# Patient Record
Sex: Female | Born: 1935 | Race: White | Hispanic: No | Marital: Married | Smoking: Never smoker
Health system: Southern US, Community
[De-identification: ages and names within clinical notes are randomized; demographics above are authoritative.]

## PROBLEM LIST (undated history)

## (undated) DIAGNOSIS — Z9889 Other specified postprocedural states: Secondary | ICD-10-CM

## (undated) DIAGNOSIS — H3530 Unspecified macular degeneration: Secondary | ICD-10-CM

## (undated) DIAGNOSIS — Z8679 Personal history of other diseases of the circulatory system: Secondary | ICD-10-CM

## (undated) DIAGNOSIS — G20A1 Parkinson's disease without dyskinesia, without mention of fluctuations: Secondary | ICD-10-CM

## (undated) DIAGNOSIS — R112 Nausea with vomiting, unspecified: Secondary | ICD-10-CM

## (undated) DIAGNOSIS — W19XXXA Unspecified fall, initial encounter: Secondary | ICD-10-CM

## (undated) DIAGNOSIS — Z973 Presence of spectacles and contact lenses: Secondary | ICD-10-CM

## (undated) DIAGNOSIS — G20 Parkinson's disease: Secondary | ICD-10-CM

## (undated) DIAGNOSIS — S22000A Wedge compression fracture of unspecified thoracic vertebra, initial encounter for closed fracture: Secondary | ICD-10-CM

## (undated) DIAGNOSIS — R296 Repeated falls: Secondary | ICD-10-CM

## (undated) DIAGNOSIS — R609 Edema, unspecified: Secondary | ICD-10-CM

## (undated) DIAGNOSIS — R600 Localized edema: Secondary | ICD-10-CM

## (undated) DIAGNOSIS — L570 Actinic keratosis: Secondary | ICD-10-CM

## (undated) DIAGNOSIS — C801 Malignant (primary) neoplasm, unspecified: Secondary | ICD-10-CM

## (undated) DIAGNOSIS — E559 Vitamin D deficiency, unspecified: Secondary | ICD-10-CM

## (undated) DIAGNOSIS — M1990 Unspecified osteoarthritis, unspecified site: Secondary | ICD-10-CM

## (undated) DIAGNOSIS — M479 Spondylosis, unspecified: Secondary | ICD-10-CM

## (undated) DIAGNOSIS — E039 Hypothyroidism, unspecified: Secondary | ICD-10-CM

## (undated) DIAGNOSIS — R1013 Epigastric pain: Secondary | ICD-10-CM

## (undated) DIAGNOSIS — K859 Acute pancreatitis without necrosis or infection, unspecified: Secondary | ICD-10-CM

## (undated) DIAGNOSIS — M810 Age-related osteoporosis without current pathological fracture: Secondary | ICD-10-CM

## (undated) DIAGNOSIS — K219 Gastro-esophageal reflux disease without esophagitis: Secondary | ICD-10-CM

## (undated) DIAGNOSIS — E7800 Pure hypercholesterolemia, unspecified: Secondary | ICD-10-CM

## (undated) HISTORY — DX: Repeated falls: R29.6

## (undated) HISTORY — PX: COLONOSCOPY: SHX174

## (undated) HISTORY — DX: Actinic keratosis: L57.0

## (undated) HISTORY — DX: Parkinson's disease: G20

## (undated) HISTORY — PX: MOHS SURGERY: SUR867

## (undated) HISTORY — PX: TONSILLECTOMY: SUR1361

## (undated) HISTORY — DX: Vitamin D deficiency, unspecified: E55.9

## (undated) HISTORY — PX: HEMORRHOID SURGERY: SHX153

## (undated) HISTORY — PX: APPENDECTOMY: SHX54

## (undated) HISTORY — DX: Unspecified fall, initial encounter: W19.XXXA

## (undated) HISTORY — DX: Wedge compression fracture of unspecified thoracic vertebra, initial encounter for closed fracture: S22.000A

## (undated) HISTORY — PX: CARPAL TUNNEL RELEASE: SHX101

## (undated) HISTORY — DX: Localized edema: R60.0

## (undated) HISTORY — PX: EYE SURGERY: SHX253

## (undated) HISTORY — DX: Unspecified macular degeneration: H35.30

## (undated) HISTORY — DX: Pure hypercholesterolemia, unspecified: E78.00

## (undated) HISTORY — DX: Personal history of other diseases of the circulatory system: Z86.79

## (undated) HISTORY — DX: Edema, unspecified: R60.9

## (undated) HISTORY — PX: HAND SURGERY: SHX662

## (undated) HISTORY — DX: Acute pancreatitis without necrosis or infection, unspecified: K85.90

## (undated) HISTORY — DX: Hypercalcemia: E83.52

## (undated) HISTORY — PX: CATARACT EXTRACTION: SUR2

## (undated) HISTORY — DX: Parkinson's disease without dyskinesia, without mention of fluctuations: G20.A1

## (undated) HISTORY — DX: Spondylosis, unspecified: M47.9

## (undated) HISTORY — DX: Epigastric pain: R10.13

## (undated) HISTORY — DX: Malignant (primary) neoplasm, unspecified: C80.1

## (undated) HISTORY — DX: Age-related osteoporosis without current pathological fracture: M81.0

## (undated) HISTORY — PX: ABDOMINAL HYSTERECTOMY: SHX81

---

## 1999-08-16 ENCOUNTER — Ambulatory Visit (HOSPITAL_COMMUNITY): Admission: RE | Admit: 1999-08-16 | Discharge: 1999-08-16 | Payer: Self-pay | Admitting: *Deleted

## 1999-08-29 ENCOUNTER — Encounter: Payer: Self-pay | Admitting: Family Medicine

## 1999-08-29 ENCOUNTER — Encounter: Admission: RE | Admit: 1999-08-29 | Discharge: 1999-08-29 | Payer: Self-pay | Admitting: Family Medicine

## 2000-08-30 ENCOUNTER — Encounter: Admission: RE | Admit: 2000-08-30 | Discharge: 2000-08-30 | Payer: Self-pay | Admitting: Family Medicine

## 2000-08-30 ENCOUNTER — Encounter: Payer: Self-pay | Admitting: Family Medicine

## 2000-09-04 ENCOUNTER — Ambulatory Visit (HOSPITAL_COMMUNITY): Admission: RE | Admit: 2000-09-04 | Discharge: 2000-09-04 | Payer: Self-pay | Admitting: Surgery

## 2000-09-04 ENCOUNTER — Encounter: Payer: Self-pay | Admitting: Surgery

## 2001-09-02 ENCOUNTER — Encounter: Admission: RE | Admit: 2001-09-02 | Discharge: 2001-09-02 | Payer: Self-pay | Admitting: Family Medicine

## 2001-09-02 ENCOUNTER — Encounter: Payer: Self-pay | Admitting: Family Medicine

## 2002-01-22 ENCOUNTER — Encounter: Payer: Self-pay | Admitting: Family Medicine

## 2002-01-22 ENCOUNTER — Other Ambulatory Visit: Admission: RE | Admit: 2002-01-22 | Discharge: 2002-01-22 | Payer: Self-pay | Admitting: Family Medicine

## 2002-01-22 ENCOUNTER — Ambulatory Visit (HOSPITAL_COMMUNITY): Admission: RE | Admit: 2002-01-22 | Discharge: 2002-01-22 | Payer: Self-pay | Admitting: Family Medicine

## 2002-09-03 ENCOUNTER — Encounter: Admission: RE | Admit: 2002-09-03 | Discharge: 2002-09-03 | Payer: Self-pay | Admitting: Family Medicine

## 2002-09-03 ENCOUNTER — Encounter: Payer: Self-pay | Admitting: Family Medicine

## 2003-07-22 ENCOUNTER — Ambulatory Visit (HOSPITAL_COMMUNITY): Admission: RE | Admit: 2003-07-22 | Discharge: 2003-07-22 | Payer: Self-pay | Admitting: Gastroenterology

## 2003-09-16 ENCOUNTER — Encounter: Admission: RE | Admit: 2003-09-16 | Discharge: 2003-09-16 | Payer: Self-pay | Admitting: Family Medicine

## 2004-10-04 ENCOUNTER — Encounter: Admission: RE | Admit: 2004-10-04 | Discharge: 2004-10-04 | Payer: Self-pay | Admitting: Family Medicine

## 2005-03-01 ENCOUNTER — Other Ambulatory Visit: Admission: RE | Admit: 2005-03-01 | Discharge: 2005-03-01 | Payer: Self-pay | Admitting: Family Medicine

## 2005-07-27 ENCOUNTER — Encounter: Admission: RE | Admit: 2005-07-27 | Discharge: 2005-07-27 | Payer: Self-pay | Admitting: Orthopedic Surgery

## 2005-08-02 ENCOUNTER — Ambulatory Visit (HOSPITAL_COMMUNITY): Admission: RE | Admit: 2005-08-02 | Discharge: 2005-08-02 | Payer: Self-pay | Admitting: Orthopedic Surgery

## 2005-08-02 ENCOUNTER — Encounter (INDEPENDENT_AMBULATORY_CARE_PROVIDER_SITE_OTHER): Payer: Self-pay | Admitting: *Deleted

## 2005-08-02 ENCOUNTER — Ambulatory Visit (HOSPITAL_BASED_OUTPATIENT_CLINIC_OR_DEPARTMENT_OTHER): Admission: RE | Admit: 2005-08-02 | Discharge: 2005-08-02 | Payer: Self-pay | Admitting: Orthopedic Surgery

## 2005-10-06 ENCOUNTER — Encounter: Admission: RE | Admit: 2005-10-06 | Discharge: 2005-10-06 | Payer: Self-pay | Admitting: Family Medicine

## 2006-08-24 ENCOUNTER — Emergency Department (HOSPITAL_COMMUNITY): Admission: EM | Admit: 2006-08-24 | Discharge: 2006-08-24 | Payer: Self-pay | Admitting: Emergency Medicine

## 2006-08-31 ENCOUNTER — Emergency Department (HOSPITAL_COMMUNITY): Admission: EM | Admit: 2006-08-31 | Discharge: 2006-08-31 | Payer: Self-pay | Admitting: Emergency Medicine

## 2006-10-09 ENCOUNTER — Encounter: Admission: RE | Admit: 2006-10-09 | Discharge: 2006-10-09 | Payer: Self-pay | Admitting: Family Medicine

## 2007-10-14 ENCOUNTER — Encounter: Admission: RE | Admit: 2007-10-14 | Discharge: 2007-10-14 | Payer: Self-pay | Admitting: Family Medicine

## 2007-11-07 HISTORY — PX: FOOT SURGERY: SHX648

## 2008-09-07 ENCOUNTER — Encounter: Admission: RE | Admit: 2008-09-07 | Discharge: 2008-09-07 | Payer: Self-pay | Admitting: Orthopedic Surgery

## 2008-09-08 ENCOUNTER — Ambulatory Visit (HOSPITAL_BASED_OUTPATIENT_CLINIC_OR_DEPARTMENT_OTHER): Admission: RE | Admit: 2008-09-08 | Discharge: 2008-09-08 | Payer: Self-pay | Admitting: Orthopedic Surgery

## 2008-10-14 ENCOUNTER — Encounter: Admission: RE | Admit: 2008-10-14 | Discharge: 2008-10-14 | Payer: Self-pay | Admitting: Family Medicine

## 2008-11-27 ENCOUNTER — Other Ambulatory Visit: Admission: RE | Admit: 2008-11-27 | Discharge: 2008-11-27 | Payer: Self-pay | Admitting: Family Medicine

## 2009-10-15 ENCOUNTER — Encounter: Admission: RE | Admit: 2009-10-15 | Discharge: 2009-10-15 | Payer: Self-pay | Admitting: Family Medicine

## 2010-10-17 ENCOUNTER — Encounter
Admission: RE | Admit: 2010-10-17 | Discharge: 2010-10-17 | Payer: Self-pay | Source: Home / Self Care | Attending: Family Medicine | Admitting: Family Medicine

## 2011-03-21 NOTE — Op Note (Signed)
Sarah Callahan, Sarah Callahan              ACCOUNT NO.:  [id number]   MEDICAL RECORD NO.:  [id number]          PATIENT TYPE:  AMB   LOCATION:  DSC                          FACILITY:  [facility]   PHYSICIAN:  Leonides Grills, M.D.     DATE OF BIRTH:  12-28-1930   DATE OF PROCEDURE:  09/08/2008  DATE OF DISCHARGE:                               OPERATIVE REPORT   PREOPERATIVE DIAGNOSES:  1. Left hallux valgus.  2. Left tight gastroc.  3. Left second metatarsalgia.  4. Left second through fourth hammertoes.   POSTOPERATIVE DIAGNOSES:  1. Left hallux valgus.  2. Left tight gastroc.  3. Left second metatarsalgia.  4. Left second through fourth hammertoes.   OPERATION:  1. Left Chevron bunionectomy.  2. Left great toe digital nerve neurolysis.  3. Left second Weil metatarsal shortening osteotomy.  4. Left gastroc slide.  5. Left second through fourth toes metatarsophalangeal joint dorsal      capsulotomy with collateral release.  6. Left second through fourth toes proximal phalanx head resection.  7. Left second through fourth toes extensor digitorum brevis to      extensor digitorum longus tendon transfer.  8. Left second through fourth toes flexor digitorum longus to proximal      phalanx tendon transfer.   ANESTHESIA:  General.   SURGEON:  Leonides Grills, MD   ASSISTANT:  Richardean Canal, PA-C   ESTIMATED BLOOD LOSS:  Minimal.   TOURNIQUET TIME:  Approximately hour and half.   COMPLICATIONS:  None.   DISPOSITION:  Stable to PR.   INDICATIONS:  This is a 77 year old female who had previous left second  hammertoe reconstruction that well enough having a dorsally dislocated  MTP joint and persistent pain and callus on the plantar aspect of her  great toe.  Her second toe was riding over her great toe and her hallux  valgus deformity has worsened and now has become symptomatic.  She was  consented for the above procedure.  All risks including infection,  neurovascular injury, nonunion,  malunion, hardware rotation, hardware  failure, persistent pain, worsening pain, prolonged recovery, stiffness,  arthritis, development of hallux varus as well as recurrence of hallux  valgus, cock-up toe deformity, the areas of persistent pain, worse pain  were all explained.  Questions are encouraged and answered.   OPERATION:  The patient was brought to the operating room and placed in  the supine position after adequate general endotracheal tube anesthesia  was administered as well as Ancef 1 g IV piggyback.  A bump was placed  on the left ipsilateral hip.  All bony prominence were well padded.  Left lower extremity was prepped and draped in the sterile manner with  proximally placed thigh tourniquet.  Limb was gravity exsanguinated.  Tourniquet was elevated to 290 mmHg.  A longitudinal incision over the  medial aspect of the gastrocnemius muscle tendinous junction was then  made.  Dissection was carried down through the skin.  Hemostasis was  obtained.  Fascia was opened in line with the incision.  Conjoined  region was developed through the gastroc soleus.  Soft tissue  was  elevated off the posterior aspect of the gastrocnemius.  Sural nerve was  identified and protected serially.  Gastrocnemius was then released with  a curved Mayo scissors.  This had an excellent release of the tight  gastroc.  The area was copiously irrigated with normal saline.  Subcu  was closed with 3-0 Vicryl.  Skin was closed with 4-0 Monocryl  subcuticular stitch.  Steri-Strips were applied.  We then made a  longitudinal incision midline over the medial aspect of the left great  toe MTP joint.  Dissection was carried down through the skin.  Hemostasis was obtained.  Great toe digital nerve dorsal medially was  then carefully dissected out and a formal digital nerve neurolysis was  then performed.  This was retracted out of harm's way throughout the  procedure.  A L-shaped capsulotomy was then made.  Simple  bunionectomy  was then performed with a sagittal saw.  Rocky Link Johnson's ridge was then  rounded off with a rongeur.  The area of joint was copiously irrigated  with normal saline.  Lateral capsule was then released with curved  Beaver blade protecting the cartilage surfaces with a Engineer, site.  This had  outstanding release of tight capsule.  Then, we identified the center of  the head.  Then, with a sagittal saw, a Chevron osteotomy was then  created protecting the soft tissue both superiorly and inferiorly.  Head  was then translated approximately 2-3 mm laterally and fixed with 2.0 mm  fully threaded cortical set screw using 1.5-mm drill hole respectively.  This had excellent purchase and maintenance of the correction.  The  redundant bone medially was trimmed off with the sagittal saw.  The  joint area was copiously irrigated with normal saline.  Capsules was  reconstructed and advanced both superiorly and proximally and  reconstructed with 2-0 Vicryl stitches.  This had an Conservation officer, historic buildings.  The area was copiously irrigated with normal saline.  A longitudinal  incision was then made over the left second toe.  Dissection was carried  down through the skin.  Hemostasis was obtained.  There was a tremendous  amount of scar tissue dorsally.  This was very difficult to find the  extensor tendons for the second toe.  The incision was extended  proximally and both tendons were identified.  The extensor digitorum  longus was tenotomized proximally and medially and the brevis distal  lateral was retracted out of harm's way for later transfer.  We then  performed a MTP joint dorsal capsulotomy and collaterally.  The toe was  completely dislocated and required a soft tissue release that was  relatively extensive.  We identified the FPL tendon as well and  protected obviously the soft tissues both medially and laterally.  We  then skeletonized the distal aspect of proximal phalanx and with a  sagittal  saw removed the proximal phalanx head.  We then identified the  FPL tendon.  This apparently was transferred to the base of the proximal  phalanx, but we cut this area.  We cut it toward to the proximal  phalanx, drilled a drill hole into the base of the proximal phalanx  using a 2.5 and 3.5 mm drill holes respectively and then transferred the  FDL through this drill hole using 3-0 PDS.  This had an Interior and spatial designer. We plantarflexed the second toe and then with stacked sagittal  saw blades, we performed a Weil cup metatarsal area osteotomy parallel  to the plantar aspect  of foot.  Once this was mobilized and there was  scar tissue around this area, we then fixed this with a 1.5-mm fully-  threaded cortical set screw using 1.1-mm drill hole respectively.  This  had excellent purchase and maintenance of the correction.  Redundant  bone ledge dorsally was trimmed off the rongeur.  Bone wax was applied  to expose bone surfaces.  Once the K-wire was fired antegrade through  the middle distal phalanx, we then reduced the PIP joint as well as  tension on the FDL tendon, fired this retrograde across the MTP joint.  We held the toe in an outstanding position.  We then transferred the EDB  to EDL tendon dorsally using 3-0 PDS stitch and sewed this to the stump  of the FDL tendon as well.  We then made a longitudinal incision over  the left third toe dorsally.  Dissection was carried down through the  skin.  Hemostasis was obtained.  EDB and EDL tendons were identified.  The EDL tendon was tenotomized proximal and medial in the brevis  distally and laterally retracting out of harm's way.  MTP joint dorsal  capsulotomy and collateral release were then performed with a 15 blade  scalpel protecting the soft tissue both medially and laterally.  We then  skeletonized the distal aspect of the proximal phalanx.  The head was  then removed with a rongeur followed by bone cutter.  This cut was made   perpendicular to the long axis of proximal phalanx.  We then made a  longitudinal incision into the PIP joint plantar plate.  FDL tendon was  identified identify and carefully dissect out.  A tenotomy was then  made.  We then transferred the FDL to the proximal phalanx splitting the  tendon and transferring it and then sewing it to itself with 3-0 PDS  stitch.  This had an Conservation officer, historic buildings.  We then placed a 0.054 K-wire  antegrade through the middle and distal phalanx, reduced the PIP joint  and fired this retrograde with the toe held in reduced position.  We  then transferred the EDB to EDL tendon using 3-0 PDS stitch and then  sewed this to the limbs of the FDL tendon as well.  Again, this had an  outstanding repair.  The same exact procedure was performed for the  fourth toe through a separate incision.  Tourniquet was deflated.  Hemostasis was obtained.  All toes pinked up nicely.  Stress x-rays were  obtained, AP and lateral planes showed no gross motion, fixation of  proper position, and excellent alignment as well.  Skin-relieving  incisions were made on either side of the K-wire.  The K-wires were  bent, cut, and capped.  Subcu was closed with 4-0 PDS.  Skin was closed  with 4-0 nylon.  Sterile dressing was applied.  Roger-Mann dressing was  applied.  Cam walker boot was applied.  The patient was stable to the  PR.      Leonides Grills, M.D.  Electronically Signed     PB/MEDQ  D:  09/08/2008  T:  09/09/2008  Job:  [id number]

## 2011-03-24 NOTE — Op Note (Signed)
Sarah Callahan, Sarah Callahan              ACCOUNT NO.:  [id number]   MEDICAL RECORD NO.:  [id number]          PATIENT TYPE:  AMB   LOCATION:  DSC                          FACILITY:  [facility]   PHYSICIAN:  Cindee Salt, M.D.       DATE OF BIRTH:  [birth year]   DATE OF PROCEDURE:  08/02/2005  DATE OF DISCHARGE:                                 OPERATIVE REPORT   PREOPERATIVE DIAGNOSIS:  Mass left ring finger.   POSTOPERATIVE DIAGNOSIS:  Mass left ring finger.  Plus flexor sheath cyst  left ring finger.   OPERATION:  Excision probable fibroma, excision flexor sheath cyst left ring  finger.   SURGEON:  Cindee Salt, M.D.   ASSISTANTCarolyne Fiscal.   ANESTHESIA:  General.   DATE OF OPERATION:  August 02, 2005.   HISTORY:  The patient is a 77 year old female with a history of a painful  mass over the metacarpophalangeal joint left ring finger. This appears to be  a probable early Dupuytren's nodule. She is however concerned with  considerable pain. She is admitted for excision being well aware of the  potential for extension exacerbation of Dupuytren's should this be excised.   PROCEDURE:  The patient is brought to the operating room where a general  anesthetic was carried out without difficulty after marking by both patient  and surgeon. She was prepped and draped using DuraPrep, supine position with  her left arm free. The limb was exsanguinated with an Esmarch bandage.  Tourniquet placed high on the arm was inflated to 250 mmHg. A volar Brunner  incision was made, carried down through subcutaneous tissue. The mass was  immediately apparent. This was in the palmar fascia. This was excised in  toto and sent to pathology. Beneath this with protection of the  neurovascular bundles, a flexor sheath cyst was immediately apparent. This  was excised and release of the A1 pulley performed. The cyst was sent to  pathology. The wound was copiously irrigated with saline. The skin closed  interrupted 5-0  nylon sutures. Sterile compressive dressing was applied. The  patient tolerated the procedure well and was taken to the recovery room for  observation in satisfactory condition. She is discharged home to return to  the [facility] [place] in 1 week on [place] [facility].           ______________________________  Cindee Salt, M.D.     GK/MEDQ  D:  08/02/2005  T:  08/03/2005  Job:  [id number]

## 2011-03-24 NOTE — Op Note (Signed)
   NAMECHRISTI, Sarah Callahan                        ACCOUNT NO.:  [id number]   MEDICAL RECORD NO.:  [id number]                   PATIENT TYPE:  AMB   LOCATION:  ENDO                                 FACILITY:  [facility]   PHYSICIAN:  Anselmo Rod, M.D.               DATE OF BIRTH:  1931-07-24   DATE OF PROCEDURE:  07/22/2003  DATE OF DISCHARGE:                                 OPERATIVE REPORT   PROCEDURE PERFORMED:  Screening colonoscopy.   ENDOSCOPIST:  Charna Elizabeth, M.D.   INSTRUMENT USED:  Olympus video colonoscope.   INDICATIONS FOR PROCEDURE:  History of rectal bleeding with change in bowel  habits and worsening constipation in a 77 year old white female.  Rule out  colonic polyps, masses, etc.   PREPROCEDURE PREPARATION:  Informed consent was procured from the patient.  The patient was fasted for eight hours prior to the procedure and prepped  with a bottle of magnesium citrate and a gallon of GoLYTELY the night prior  to the procedure.   PREPROCEDURE PHYSICAL:  The patient had stable vital signs.  Neck supple.  Chest clear to auscultation.  S1 and S2 regular.  Abdomen soft with normal  bowel sounds.   DESCRIPTION OF PROCEDURE:  The patient was placed in left lateral decubitus  position and sedated with 70 mg of Demerol and 7 mg of Versed in slow  incremental doses.  Once the patient was adequately sedated and maintained  on low flow oxygen and continuous cardiac monitoring, the Olympus video  colonoscope was advanced from the rectum to the cecum without difficulty.  The appendicular orifice and ileocecal valve were clearly visualized and  photographed.  A pill was noted in the cecum.  No masses, polyps, erosions,  ulcerations or diverticula were present.  Small internal hemorrhoids were  seen on retroflexion in the rectum.  The patient tolerated the procedure  well without complication.   IMPRESSION:  Normal colonoscopy up to the cecum except for small internal  hemorrhoids.   RECOMMENDATIONS:  1. A high fiber diet with liberal fluid intake has been recommended.  2. Repeat colorectal cancer screening is recommended in the next 10 years     unless the patient develops any abnormal symptoms in the interim.  3. Outpatient follow-up in the next two weeks or earlier if need be.                                                   Anselmo Rod, M.D.    JNM/MEDQ  D:  07/22/2003  T:  07/22/2003  Job:  [id number]   cc:   Stacie Acres. White, M.D.  [address]  [employer] [place]  [place] [address]  Fax: [phone]

## 2011-05-25 ENCOUNTER — Ambulatory Visit
Admission: RE | Admit: 2011-05-25 | Discharge: 2011-05-25 | Disposition: A | Discharge status: Home / Self Care | Payer: Medicare Other | Source: Ambulatory Visit | Attending: Family Medicine | Admitting: Family Medicine

## 2011-05-25 ENCOUNTER — Other Ambulatory Visit: Payer: Self-pay | Admitting: Family Medicine

## 2011-05-25 DIAGNOSIS — R609 Edema, unspecified: Secondary | ICD-10-CM

## 2011-06-13 ENCOUNTER — Ambulatory Visit
Admission: RE | Admit: 2011-06-13 | Discharge: 2011-06-13 | Disposition: A | Discharge status: Home / Self Care | Payer: Medicare Other | Source: Ambulatory Visit | Attending: Family Medicine | Admitting: Family Medicine

## 2011-06-13 ENCOUNTER — Other Ambulatory Visit: Payer: Self-pay | Admitting: Family Medicine

## 2011-06-13 DIAGNOSIS — R071 Chest pain on breathing: Secondary | ICD-10-CM

## 2011-08-08 LAB — BASIC METABOLIC PANEL
BUN: 9
CO2: 30
Calcium: 9.8
Chloride: 101
Creatinine, Ser: 0.59
GFR calc Af Amer: >60
GFR calc non Af Amer: >60
Glucose, Bld: 219 — ABNORMAL HIGH
Potassium: 3.7
Sodium: 137

## 2011-08-08 LAB — POCT HEMOGLOBIN-HEMACUE: Hemoglobin: 13.1

## 2011-08-08 LAB — GLUCOSE, CAPILLARY: Glucose-Capillary: 108 — ABNORMAL HIGH

## 2011-08-22 ENCOUNTER — Encounter (HOSPITAL_COMMUNITY): Payer: Medicare Other | Attending: Family Medicine

## 2011-08-22 DIAGNOSIS — M810 Age-related osteoporosis without current pathological fracture: Secondary | ICD-10-CM | POA: Insufficient documentation

## 2011-09-19 ENCOUNTER — Other Ambulatory Visit: Payer: Self-pay | Admitting: Family Medicine

## 2011-09-19 DIAGNOSIS — Z1231 Encounter for screening mammogram for malignant neoplasm of breast: Secondary | ICD-10-CM

## 2011-10-25 ENCOUNTER — Ambulatory Visit
Admission: RE | Admit: 2011-10-25 | Discharge: 2011-10-25 | Disposition: A | Discharge status: Home / Self Care | Payer: Medicare Other | Source: Ambulatory Visit | Attending: Family Medicine | Admitting: Family Medicine

## 2011-10-25 DIAGNOSIS — Z1231 Encounter for screening mammogram for malignant neoplasm of breast: Secondary | ICD-10-CM

## 2011-12-11 ENCOUNTER — Encounter: Payer: Medicare Other | Admitting: Vascular Surgery

## 2012-01-05 ENCOUNTER — Ambulatory Visit: Payer: Medicare Other | Attending: Diagnostic Neuroimaging | Admitting: Physical Therapy

## 2012-01-05 DIAGNOSIS — IMO0001 Reserved for inherently not codable concepts without codable children: Secondary | ICD-10-CM | POA: Insufficient documentation

## 2012-01-05 DIAGNOSIS — G20A1 Parkinson's disease without dyskinesia, without mention of fluctuations: Secondary | ICD-10-CM | POA: Insufficient documentation

## 2012-01-05 DIAGNOSIS — R269 Unspecified abnormalities of gait and mobility: Secondary | ICD-10-CM | POA: Insufficient documentation

## 2012-01-05 DIAGNOSIS — G20 Parkinson's disease: Secondary | ICD-10-CM | POA: Insufficient documentation

## 2012-01-10 ENCOUNTER — Ambulatory Visit: Payer: Medicare Other | Admitting: Physical Therapy

## 2012-01-16 ENCOUNTER — Ambulatory Visit: Payer: Medicare Other | Admitting: Physical Therapy

## 2012-01-19 ENCOUNTER — Ambulatory Visit: Payer: Medicare Other | Admitting: Physical Therapy

## 2012-01-31 ENCOUNTER — Ambulatory Visit: Payer: Medicare Other | Admitting: Physical Therapy

## 2012-02-01 ENCOUNTER — Encounter: Payer: Self-pay | Admitting: Vascular Surgery

## 2012-02-05 ENCOUNTER — Encounter: Payer: Self-pay | Admitting: Vascular Surgery

## 2012-02-05 ENCOUNTER — Ambulatory Visit (INDEPENDENT_AMBULATORY_CARE_PROVIDER_SITE_OTHER): Payer: Medicare Other | Admitting: *Deleted

## 2012-02-05 ENCOUNTER — Ambulatory Visit (INDEPENDENT_AMBULATORY_CARE_PROVIDER_SITE_OTHER): Payer: Medicare Other | Admitting: Vascular Surgery

## 2012-02-05 VITALS — BP 115/81 | HR 88 | Resp 18 | Ht 65.0 in | Wt 116.0 lb

## 2012-02-05 DIAGNOSIS — I83893 Varicose veins of bilateral lower extremities with other complications: Secondary | ICD-10-CM | POA: Insufficient documentation

## 2012-02-05 DIAGNOSIS — M7989 Other specified soft tissue disorders: Secondary | ICD-10-CM

## 2012-02-05 DIAGNOSIS — I8310 Varicose veins of unspecified lower extremity with inflammation: Secondary | ICD-10-CM

## 2012-02-05 NOTE — Progress Notes (Deleted)
Subjective:     Patient ID: Sarah Callahan, female   DOB: Oct 31, 1931, 78 y.o.   MRN: [id number]  HPI this 78 year old female is evaluated for edema in the right lower extremity. About 3-4 months ago she developed significant swelling in the right leg. Venous duplex exam revealed no evidence of DVT. She has been having persistent edema in the right leg particularly in the lower half from the mid calf distally. This has not been relieved by short leg elastic compression stockings. She has no history of DVT thrombophlebitis stasis ulcers or bleeding. She has been taking Lasix with no improvement. She does have a achy heavy feeling in the right leg as the day progresses. Left leg has no symptoms. She does have a history of Xylocaine allergy but is able to take Xylocaine if pretreated with Benadryl.  Physician Discharge Summary  Patient ID: Sarah Callahan MRN: [id number] DOB/AGE: 1931-10-07 78 y.o.  Admit date: (Not on file) Discharge date: 02/05/2012  Admission Diagnosis: @ADMDX @  Discharge Diagnoses:  @ADMDX @  Secondary Diagnoses: Active Problems:  * No active hospital problems. *    Procedures: *** HERNIA:  Sarah Callahan is an 78 y.o. female who was referred for evaluation of a  {Symptoms; hernia:13574}. Symptoms were first noted {numbers; 0-10:33138} {Time; days-years:10146} ago. Symptoms or injury {did/did not:14019} occur at work. Pain is {hernia sx:13577} and {is/is not:9024} reducible. The patient has no symptoms of  {hernia etiology:13578::"chronic constipation","chronic cough","difficulty urinating"}. There {hpi assoc has/has/not:15037} previous hx of groin surgery.      Discharged Condition: {condition:18240}  Hospital Course: ***  Consults:  @CONSULT @  Significant Diagnostic Studies: CBC    Component Value Date/Time   HGB 13.1 POINT OF CARE RESULT 09/08/2008 0833    BMET    Component Value Date/Time   NA 137 09/07/2008 1600   K 3.7 09/07/2008 1600   CL 101 09/07/2008  1600   CO2 30 09/07/2008 1600   GLUCOSE 219* 09/07/2008 1600   BUN 9 09/07/2008 1600   CREATININE 0.59 09/07/2008 1600   CALCIUM 9.8 09/07/2008 1600   GFRNONAA >60 09/07/2008 1600   GFRAA  Value: >60        The eGFR has been calculated using the MDRD equation. This calculation has not been validated in all clinical 09/07/2008 1600    COAG No results found for this basename: INR, PROTIME   No results found for this basename: PTT    Disposition: Final discharge disposition not confirmed   Med List    Warning       Cannot display discharge medications because this is not an admission.              SignedJosephina Callahan 02/05/2012, 10:45 AM      Review of Systems patient has history of Parkinson's disease which affects her ability to ambulate long distances. He is able amulet one block or so. Denies chest pain, dyspnea on exertion, PND, orthopnea, hemoptysis. Does have a Xylocaine allergy which affected her many years ago with rash and wheezing.     Objective:   Physical Exam     Assessment:     ***    Plan:     ***

## 2012-02-05 NOTE — Progress Notes (Signed)
Addended by: Merri Ray A on: 02/05/2012 03:22 PM   Modules accepted: Orders

## 2012-02-05 NOTE — Progress Notes (Signed)
Subjective:     Patient ID: Sarah Callahan, female   DOB: 1931-07-30, 79 y.o.   MRN: [id number]  HPI this 78 year old female is evaluated for recent onset of edema in the right leg about 3-4 months ago. She had a venous duplex exam which revealed no evidence of DVT when the swelling began. She has no history of bulging varicose veins but has diffuse spider veins. She had no previous history of swelling in the right leg nor stasis ulcers, bleeding, DVT, or thrombophlebitis. The swelling has not improved with short leg elastic compression stockings. She develops a heavy aching feeling as the day progresses. This has not been improved by short leg elastic stockings. The left leg is asymptomatic. He does have a history of a Xylocaine allergy and that she is able to tolerate Xylocaine if pretreated with Benadryl.  Past Medical History  Diagnosis Date  . Diabetes mellitus   . Parkinson disease     History  Substance Use Topics  . Smoking status: Never Smoker   . Smokeless tobacco: Never Used  . Alcohol Use: No    Family History  Problem Relation Age of Onset  . Heart disease Mother   . Heart disease Father   . Heart disease Brother   . Heart disease Maternal Grandmother   . Heart disease Maternal Grandfather   . Heart disease Paternal Grandmother   . Heart disease Paternal Grandfather     Allergies  Allergen Reactions  . Codeine   . E-Mycin (Erythromycin Base) Nausea And Vomiting  . Keflex   . Penicillins   . Sulfa Drugs Cross Reactors   . Mercury Rash    Current outpatient prescriptions:carbidopa-levodopa (SINEMET) 25-100 MG per tablet, Take 1 tablet by mouth 3 (three) times daily., Disp: , Rfl: ;  furosemide (LASIX) 20 MG tablet, Take 20 mg by mouth 2 (two) times daily., Disp: , Rfl: ;  levothyroxine (SYNTHROID, LEVOTHROID) 75 MCG tablet, Take 75 mcg by mouth daily., Disp: , Rfl: ;  omeprazole (PRILOSEC) 40 MG capsule, Take 40 mg by mouth daily., Disp: , Rfl:  PARoxetine (PAXIL) 10  MG tablet, Take 10 mg by mouth every morning., Disp: , Rfl: ;  Vitamin D, Ergocalciferol, (DRISDOL) 50000 UNITS CAPS, Take 50,000 Units by mouth., Disp: , Rfl:   BP 115/81  Pulse 88  Resp 18  Ht 5\' 5"  (1.651 m)  Wt 116 lb (52.617 kg)  BMI 19.30 kg/m2  Body mass index is 19.30 kg/(m^2).         Review of Systems denies chest pain, dyspnea on exertion, PND, orthopnea,, [place]. Patient is not ambulate long distances because of her Parkinson's disease. Does have a remote history of Xylocaine allergy but has taken Xylocaine after being pretreated with Benadryl for dental appointments.    Objective:   Physical Exam blood pressure 115/81 heart rate 88 respirations 18 Gen.-alert and oriented x3 in no apparent distress HEENT normal for age Lungs no rhonchi or wheezing Cardiovascular regular rhythm no murmurs carotid pulses 3+ palpable no bruits audible Abdomen soft nontender no palpable masses Musculoskeletal free of  major deformities Skin clear -no rashes Neurologic normal Lower extremities 3+ femoral and dorsalis pedis pulses palpable bilaterally with 1-2+ edema in the right leg from the midcalf distally no edema on the left. There is 1-1/2 cm of discrepancy in circumference of the right ankle. Spider veins are present in both legs in the thigh and calf areas. No bulging varicosities are noted.  Today I ordered a venous  duplex exam of the right leg which are reviewed and interpreted. The right great saphenous vein has gross reflux from the saphenofemoral junction to the proximal calf. The deep system is normal with no evidence of DVT or reflux appear       Assessment:     Edema right lower extremity with increasing symptomatology despite elastic compression stockings. Gross reflux right great saphenous vein possibly causing above symptoms    Plan:     #1 long light elastic compression stockings 20-30 mm gradient #2 elevate legs during the day as much as possible #3 ibuprofen on  a regular basis #4 return in 3 months-if continued to have significant edema and symptoms we'll consider laser ablation right great saphenous vein If this is done patient will require pretreatment with Benadryl

## 2012-02-06 ENCOUNTER — Encounter: Payer: Self-pay | Admitting: Vascular Surgery

## 2012-02-07 ENCOUNTER — Ambulatory Visit: Payer: Medicare Other | Admitting: Physical Therapy

## 2012-02-12 NOTE — Procedures (Unsigned)
LOWER EXTREMITY VENOUS REFLUX EXAM  INDICATION:  Right lower extremity edema.  EXAM:  Using color-flow imaging and pulse Doppler spectral analysis, the right common femoral, femoral, popliteal, posterior tibial, greater and lesser saphenous veins were evaluated.  There is no evidence suggesting deep venous insufficiency in the right lower extremity.  The right saphenofemoral junction is not competent with Reflux of >545milliseconds. The right GSV is not competent with Reflux of >590milliseconds with the caliber as described below.   The right proximal short saphenous vein demonstrates competency.  GSV Diameter (used if found to be incompetent only)                                           Right    Left Proximal Greater Saphenous Vein           0.57 cm  cm Proximal-to-mid-thigh                     0.47 cm  cm Mid thigh                                 0.40 cm  cm Mid-distal thigh                          cm       cm Distal thigh                              0.37 cm  cm Knee                                      0.37 cm  cm  IMPRESSION: 1. The right great saphenous vein is not competent with reflux of     >554milliseconds. 2. The right great saphenous vein is not tortuous. 3. The deep venous system is competent. 4. The right small saphenous vein is competent.  ___________________________________________ Quita Skye. Hart Rochester, M.D.  EM/MEDQ  D:  02/06/2012  T:  02/06/2012  Job:  [id number]  cc:   Stacie Acres. Cliffton Asters, M.D.

## 2012-05-14 ENCOUNTER — Ambulatory Visit: Payer: Medicare Other | Admitting: Vascular Surgery

## 2012-06-03 ENCOUNTER — Ambulatory Visit: Payer: Medicare Other | Admitting: Vascular Surgery

## 2012-06-04 ENCOUNTER — Ambulatory Visit: Payer: Medicare Other | Admitting: Vascular Surgery

## 2012-06-17 ENCOUNTER — Encounter: Payer: Self-pay | Admitting: Vascular Surgery

## 2012-06-18 ENCOUNTER — Encounter: Payer: Self-pay | Admitting: Vascular Surgery

## 2012-06-18 ENCOUNTER — Ambulatory Visit (INDEPENDENT_AMBULATORY_CARE_PROVIDER_SITE_OTHER): Payer: Medicare Other | Admitting: Vascular Surgery

## 2012-06-18 VITALS — BP 114/69 | HR 86 | Resp 16 | Ht 66.0 in | Wt 124.6 lb

## 2012-06-18 DIAGNOSIS — I83893 Varicose veins of bilateral lower extremities with other complications: Secondary | ICD-10-CM

## 2012-06-18 NOTE — Progress Notes (Signed)
Subjective:     Patient ID: Sarah Callahan, female   DOB: 1931/03/30, 78 y.o.   MRN: [id number]  HPI this 78 year old female returns today for further evaluation for venous insufficiency right leg. I saw her 3 months ago for a history of recent onset edema. She had no evidence of DVT on duplex scanning. She did have gross reflux in the right great saphenous vein. She had no bulging varicosities. Over the past 3 months the swelling has completely resolved. She is not needing to wear the elastic compression stockings on a daily basis in the pain is resolved along with the edema. Left leg is also asymptomatic at this time.  Past Medical History  Diagnosis Date  . Diabetes mellitus   . Parkinson disease     History  Substance Use Topics  . Smoking status: Never Smoker   . Smokeless tobacco: Never Used  . Alcohol Use: No    Family History  Problem Relation Age of Onset  . Heart disease Mother   . Heart disease Father   . Heart disease Brother   . Heart disease Maternal Grandmother   . Heart disease Maternal Grandfather   . Heart disease Paternal Grandmother   . Heart disease Paternal Grandfather     Allergies  Allergen Reactions  . Cephalexin   . Codeine   . E-Mycin (Erythromycin Base) Nausea And Vomiting  . Lidocaine     Feels hot, turns red  . Penicillins   . Sulfa Drugs Cross Reactors   . Mercury Rash    Current outpatient prescriptions:carbidopa-levodopa (SINEMET) 25-100 MG per tablet, Take 1 tablet by mouth 3 (three) times daily., Disp: , Rfl: ;  furosemide (LASIX) 20 MG tablet, Take 20 mg by mouth 2 (two) times daily., Disp: , Rfl: ;  levothyroxine (SYNTHROID, LEVOTHROID) 75 MCG tablet, Take 75 mcg by mouth daily., Disp: , Rfl: ;  omeprazole (PRILOSEC) 40 MG capsule, Take 40 mg by mouth daily., Disp: , Rfl:  PARoxetine (PAXIL) 10 MG tablet, Take 10 mg by mouth every morning., Disp: , Rfl: ;  Vitamin D, Ergocalciferol, (DRISDOL) 50000 UNITS CAPS, Take 50,000 Units by mouth.,  Disp: , Rfl:   BP 114/69  Pulse 86  Resp 16  Ht 5\' 6"  (1.676 m)  Wt 124 lb 9.6 oz (56.518 kg)  BMI 20.11 kg/m2  Body mass index is 20.11 kg/(m^2).          Review of Systems denies chest pain, dyspnea on exertion, PND, orthopnea.     Objective:   Physical Exam blood pressure 114/69 heart rate 86 respirations 16 General elderly female in no apparent stress alert and oriented x3 Lungs no rhonchi or wheezing Lower extremities 3+ femoral to posterior cells pedis pulses palpable. No significant edema is noted on the left and minimal edema on the right. A few spider veins are noted no bulging varicosities or ulceration.     Assessment:     Edema right leg-now resolved Reflux right great saphenous system-does not need treatment at current time    Plan:     Return to see [place] on when necessary basis

## 2012-09-27 ENCOUNTER — Other Ambulatory Visit: Payer: Self-pay | Admitting: Family Medicine

## 2012-09-27 DIAGNOSIS — R198 Other specified symptoms and signs involving the digestive system and abdomen: Secondary | ICD-10-CM

## 2012-10-01 ENCOUNTER — Ambulatory Visit
Admission: RE | Admit: 2012-10-01 | Discharge: 2012-10-01 | Disposition: A | Discharge status: Home / Self Care | Payer: Medicare Other | Source: Ambulatory Visit | Attending: Family Medicine | Admitting: Family Medicine

## 2012-10-01 DIAGNOSIS — R198 Other specified symptoms and signs involving the digestive system and abdomen: Secondary | ICD-10-CM

## 2012-10-01 MED ORDER — IOHEXOL 300 MG/ML  SOLN
100.0000 mL | Freq: Once | INTRAMUSCULAR | Status: AC | PRN
  Administered 2012-10-01: 100 mL via INTRAVENOUS

## 2012-10-02 ENCOUNTER — Other Ambulatory Visit: Payer: Self-pay | Admitting: Family Medicine

## 2012-10-02 DIAGNOSIS — Z1231 Encounter for screening mammogram for malignant neoplasm of breast: Secondary | ICD-10-CM

## 2012-11-18 ENCOUNTER — Ambulatory Visit
Admission: RE | Admit: 2012-11-18 | Discharge: 2012-11-18 | Disposition: A | Discharge status: Home / Self Care | Payer: Medicare Other | Source: Ambulatory Visit | Attending: Family Medicine | Admitting: Family Medicine

## 2012-11-18 DIAGNOSIS — Z1231 Encounter for screening mammogram for malignant neoplasm of breast: Secondary | ICD-10-CM

## 2012-12-27 ENCOUNTER — Other Ambulatory Visit: Payer: Self-pay | Admitting: Family Medicine

## 2013-02-04 ENCOUNTER — Other Ambulatory Visit: Payer: Self-pay | Admitting: Diagnostic Neuroimaging

## 2013-02-13 ENCOUNTER — Other Ambulatory Visit (HOSPITAL_COMMUNITY): Payer: Self-pay | Admitting: Family Medicine

## 2013-02-13 DIAGNOSIS — R7989 Other specified abnormal findings of blood chemistry: Secondary | ICD-10-CM

## 2013-02-24 ENCOUNTER — Encounter (HOSPITAL_COMMUNITY): Payer: Medicare Other

## 2013-02-27 ENCOUNTER — Encounter (HOSPITAL_COMMUNITY): Payer: Medicare Other

## 2013-03-04 ENCOUNTER — Encounter (HOSPITAL_COMMUNITY)
Admission: RE | Admit: 2013-03-04 | Discharge: 2013-03-04 | Disposition: A | Discharge status: Home / Self Care | Payer: Medicare Other | Source: Ambulatory Visit | Attending: Family Medicine | Admitting: Family Medicine

## 2013-03-04 DIAGNOSIS — E215 Disorder of parathyroid gland, unspecified: Secondary | ICD-10-CM | POA: Insufficient documentation

## 2013-03-04 DIAGNOSIS — R7989 Other specified abnormal findings of blood chemistry: Secondary | ICD-10-CM

## 2013-03-04 MED ORDER — TECHNETIUM TC 99M SESTAMIBI GENERIC - CARDIOLITE
25.0000 | Freq: Once | INTRAVENOUS | Status: AC | PRN
  Administered 2013-03-04: 25 via INTRAVENOUS

## 2013-04-08 ENCOUNTER — Other Ambulatory Visit: Payer: Self-pay | Admitting: Orthopedic Surgery

## 2013-04-24 ENCOUNTER — Encounter (HOSPITAL_BASED_OUTPATIENT_CLINIC_OR_DEPARTMENT_OTHER): Payer: Self-pay | Admitting: *Deleted

## 2013-04-24 NOTE — Progress Notes (Signed)
No heart or resp problems 

## 2013-04-30 ENCOUNTER — Ambulatory Visit (HOSPITAL_BASED_OUTPATIENT_CLINIC_OR_DEPARTMENT_OTHER): Payer: Medicare Other | Admitting: Anesthesiology

## 2013-04-30 ENCOUNTER — Encounter (HOSPITAL_BASED_OUTPATIENT_CLINIC_OR_DEPARTMENT_OTHER): Payer: Self-pay | Admitting: Anesthesiology

## 2013-04-30 ENCOUNTER — Encounter (HOSPITAL_BASED_OUTPATIENT_CLINIC_OR_DEPARTMENT_OTHER)
Admission: RE | Disposition: A | Discharge status: Home / Self Care | Payer: Self-pay | Source: Ambulatory Visit | Attending: Orthopedic Surgery

## 2013-04-30 ENCOUNTER — Ambulatory Visit (HOSPITAL_BASED_OUTPATIENT_CLINIC_OR_DEPARTMENT_OTHER)
Admission: RE | Admit: 2013-04-30 | Discharge: 2013-04-30 | Disposition: A | Discharge status: Home / Self Care | Payer: Medicare Other | Source: Ambulatory Visit | Attending: Orthopedic Surgery | Admitting: Orthopedic Surgery

## 2013-04-30 DIAGNOSIS — G20A1 Parkinson's disease without dyskinesia, without mention of fluctuations: Secondary | ICD-10-CM | POA: Insufficient documentation

## 2013-04-30 DIAGNOSIS — F3289 Other specified depressive episodes: Secondary | ICD-10-CM | POA: Insufficient documentation

## 2013-04-30 DIAGNOSIS — K219 Gastro-esophageal reflux disease without esophagitis: Secondary | ICD-10-CM | POA: Insufficient documentation

## 2013-04-30 DIAGNOSIS — Z882 Allergy status to sulfonamides status: Secondary | ICD-10-CM | POA: Insufficient documentation

## 2013-04-30 DIAGNOSIS — M129 Arthropathy, unspecified: Secondary | ICD-10-CM | POA: Insufficient documentation

## 2013-04-30 DIAGNOSIS — Z880 Allergy status to penicillin: Secondary | ICD-10-CM | POA: Insufficient documentation

## 2013-04-30 DIAGNOSIS — Z79899 Other long term (current) drug therapy: Secondary | ICD-10-CM | POA: Insufficient documentation

## 2013-04-30 DIAGNOSIS — M6530 Trigger finger, unspecified finger: Secondary | ICD-10-CM | POA: Insufficient documentation

## 2013-04-30 DIAGNOSIS — Z885 Allergy status to narcotic agent status: Secondary | ICD-10-CM | POA: Insufficient documentation

## 2013-04-30 DIAGNOSIS — E119 Type 2 diabetes mellitus without complications: Secondary | ICD-10-CM | POA: Insufficient documentation

## 2013-04-30 DIAGNOSIS — Z886 Allergy status to analgesic agent status: Secondary | ICD-10-CM | POA: Insufficient documentation

## 2013-04-30 DIAGNOSIS — Z888 Allergy status to other drugs, medicaments and biological substances status: Secondary | ICD-10-CM | POA: Insufficient documentation

## 2013-04-30 DIAGNOSIS — Z881 Allergy status to other antibiotic agents status: Secondary | ICD-10-CM | POA: Insufficient documentation

## 2013-04-30 DIAGNOSIS — G20 Parkinson's disease: Secondary | ICD-10-CM | POA: Insufficient documentation

## 2013-04-30 DIAGNOSIS — F329 Major depressive disorder, single episode, unspecified: Secondary | ICD-10-CM | POA: Insufficient documentation

## 2013-04-30 DIAGNOSIS — E039 Hypothyroidism, unspecified: Secondary | ICD-10-CM | POA: Insufficient documentation

## 2013-04-30 HISTORY — DX: Hypothyroidism, unspecified: E03.9

## 2013-04-30 HISTORY — PX: TRIGGER FINGER RELEASE: SHX641

## 2013-04-30 HISTORY — DX: Other specified postprocedural states: R11.2

## 2013-04-30 HISTORY — DX: Gastro-esophageal reflux disease without esophagitis: K21.9

## 2013-04-30 HISTORY — DX: Other specified postprocedural states: Z98.890

## 2013-04-30 HISTORY — DX: Presence of spectacles and contact lenses: Z97.3

## 2013-04-30 HISTORY — DX: Unspecified osteoarthritis, unspecified site: M19.90

## 2013-04-30 LAB — POCT I-STAT, CHEM 8
Chloride: 99 mEq/L (ref 96–112)
HCT: 42 % (ref 36.0–46.0)
Hemoglobin: 14.3 g/dL (ref 12.0–15.0)
Potassium: 4 mEq/L (ref 3.5–5.1)
Sodium: 141 mEq/L (ref 135–145)

## 2013-04-30 SURGERY — RELEASE, A1 PULLEY, FOR TRIGGER FINGER
Anesthesia: Regional | Site: Hand | Laterality: Right | Wound class: Clean

## 2013-04-30 MED ORDER — FENTANYL CITRATE 0.05 MG/ML IJ SOLN: 25.0000 ug | INTRAMUSCULAR | Status: DC | PRN

## 2013-04-30 MED ORDER — OXYCODONE HCL 5 MG/5ML PO SOLN: 5.0000 mg | Freq: Once | ORAL | Status: DC | PRN

## 2013-04-30 MED ORDER — PROPOFOL 10 MG/ML IV BOLUS
INTRAVENOUS | Status: DC | PRN
  Administered 2013-04-30: 10 mg via INTRAVENOUS

## 2013-04-30 MED ORDER — BUPIVACAINE HCL (PF) 0.25 % IJ SOLN
INTRAMUSCULAR | Status: DC | PRN
  Administered 2013-04-30: 5 mL

## 2013-04-30 MED ORDER — LACTATED RINGERS IV SOLN
INTRAVENOUS | Status: DC
  Administered 2013-04-30: 08:00:00 via INTRAVENOUS

## 2013-04-30 MED ORDER — PROMETHAZINE HCL 25 MG/ML IJ SOLN: 6.2500 mg | INTRAMUSCULAR | Status: DC | PRN

## 2013-04-30 MED ORDER — VANCOMYCIN HCL IN DEXTROSE 1-5 GM/200ML-% IV SOLN
1000.0000 mg | INTRAVENOUS | Status: AC
  Administered 2013-04-30 (×2): 1000 mg via INTRAVENOUS

## 2013-04-30 MED ORDER — OXYCODONE-ACETAMINOPHEN 5-325 MG PO TABS: 1.0000 | ORAL_TABLET | ORAL | Status: DC | PRN

## 2013-04-30 MED ORDER — PROPOFOL INFUSION 10 MG/ML OPTIME
INTRAVENOUS | Status: DC | PRN
  Administered 2013-04-30: 50 ug/kg/min via INTRAVENOUS

## 2013-04-30 MED ORDER — CHLORHEXIDINE GLUCONATE 4 % EX LIQD: 60.0000 mL | Freq: Once | CUTANEOUS | Status: DC

## 2013-04-30 MED ORDER — MIDAZOLAM HCL 2 MG/2ML IJ SOLN: 0.5000 mg | Freq: Once | INTRAMUSCULAR | Status: DC | PRN

## 2013-04-30 MED ORDER — OXYCODONE HCL 5 MG PO TABS: 5.0000 mg | ORAL_TABLET | Freq: Once | ORAL | Status: DC | PRN

## 2013-04-30 MED ORDER — MIDAZOLAM HCL 2 MG/2ML IJ SOLN: 1.0000 mg | INTRAMUSCULAR | Status: DC | PRN

## 2013-04-30 MED ORDER — MEPERIDINE HCL 25 MG/ML IJ SOLN: 6.2500 mg | INTRAMUSCULAR | Status: DC | PRN

## 2013-04-30 MED ORDER — FENTANYL CITRATE 0.05 MG/ML IJ SOLN
INTRAMUSCULAR | Status: DC | PRN
  Administered 2013-04-30: 50 ug via INTRAVENOUS

## 2013-04-30 MED ORDER — LIDOCAINE HCL (PF) 0.5 % IJ SOLN
INTRAMUSCULAR | Status: DC | PRN
  Administered 2013-04-30: 30 mL via INTRAVENOUS

## 2013-04-30 MED ORDER — FENTANYL CITRATE 0.05 MG/ML IJ SOLN: 50.0000 ug | INTRAMUSCULAR | Status: DC | PRN

## 2013-04-30 SURGICAL SUPPLY — 35 items
BANDAGE COBAN STERILE 2 (GAUZE/BANDAGES/DRESSINGS) ×2 IMPLANT
BLADE SURG 15 STRL LF DISP TIS (BLADE) ×1 IMPLANT
BLADE SURG 15 STRL SS (BLADE) ×2
BNDG CMPR 9X4 STRL LF SNTH (GAUZE/BANDAGES/DRESSINGS)
BNDG ESMARK 4X9 LF (GAUZE/BANDAGES/DRESSINGS) IMPLANT
CHLORAPREP W/TINT 26ML (MISCELLANEOUS) ×2 IMPLANT
CLOTH BEACON ORANGE TIMEOUT ST (SAFETY) ×2 IMPLANT
CORDS BIPOLAR (ELECTRODE) IMPLANT
COVER MAYO STAND STRL (DRAPES) ×2 IMPLANT
COVER TABLE BACK 60X90 (DRAPES) ×2 IMPLANT
CUFF TOURNIQUET SINGLE 18IN (TOURNIQUET CUFF) IMPLANT
DECANTER SPIKE VIAL GLASS SM (MISCELLANEOUS) IMPLANT
DRAPE EXTREMITY T 121X128X90 (DRAPE) ×2 IMPLANT
DRAPE SURG 17X23 STRL (DRAPES) ×2 IMPLANT
GAUZE XEROFORM 1X8 LF (GAUZE/BANDAGES/DRESSINGS) ×2 IMPLANT
GLOVE BIO SURGEON STRL SZ 6.5 (GLOVE) ×2 IMPLANT
GLOVE BIOGEL PI IND STRL 7.0 (GLOVE) IMPLANT
GLOVE BIOGEL PI IND STRL 8.5 (GLOVE) ×1 IMPLANT
GLOVE BIOGEL PI INDICATOR 7.0 (GLOVE) ×1
GLOVE BIOGEL PI INDICATOR 8.5 (GLOVE) ×1
GLOVE SURG ORTHO 8.0 STRL STRW (GLOVE) ×2 IMPLANT
GOWN BRE IMP PREV XXLGXLNG (GOWN DISPOSABLE) ×2 IMPLANT
GOWN PREVENTION PLUS XLARGE (GOWN DISPOSABLE) ×2 IMPLANT
NEEDLE 27GAX1X1/2 (NEEDLE) ×2 IMPLANT
NS IRRIG 1000ML POUR BTL (IV SOLUTION) ×2 IMPLANT
PACK BASIN DAY SURGERY FS (CUSTOM PROCEDURE TRAY) ×2 IMPLANT
PADDING CAST ABS 4INX4YD NS (CAST SUPPLIES) ×1
PADDING CAST ABS COTTON 4X4 ST (CAST SUPPLIES) ×1 IMPLANT
SPONGE GAUZE 4X4 12PLY (GAUZE/BANDAGES/DRESSINGS) ×2 IMPLANT
STOCKINETTE 4X48 STRL (DRAPES) ×2 IMPLANT
SUT VICRYL RAPIDE 4/0 PS 2 (SUTURE) ×2 IMPLANT
SYR BULB 3OZ (MISCELLANEOUS) ×2 IMPLANT
SYR CONTROL 10ML LL (SYRINGE) ×2 IMPLANT
TOWEL OR 17X24 6PK STRL BLUE (TOWEL DISPOSABLE) ×4 IMPLANT
UNDERPAD 30X30 INCONTINENT (UNDERPADS AND DIAPERS) ×2 IMPLANT

## 2013-04-30 NOTE — Op Note (Signed)
Dictation Number [id number]

## 2013-04-30 NOTE — H&P (Signed)
Sarah Callahan is an 79 year old  right hand dominant female with catching of her right ring and left small finger for the past 3-4 months. She is s/p release A-1 pulley of her left ring finger multiple years ago. She does have a history of diabetes. Her last A1C was 6.2. She complains of severe aching, burning pain. She is not complaining of any numbness or tingling. Activity makes it worse. She will frequently have to take her opposite hand to straighten this out.   PAST MEDICAL HISTORY: She is allergic to PCN, Novocaine, Keflex, sulfa, E-Mycin, Darvocet, Neurontin, Elavil, Codeine, Pravastatin and Tramadol. Past surgery includes left foot surgery by Dr. Lestine Box, bilateral cataracts, hemorrhoidectomy, bilateral carpal tunnel release, oophorectomy. She is on the following medications: Levothyroxine, Paxil, Carbidopa-Levodopa, vitamin B12, vitamin C, CoQ10, flax seed oil, omega 3, vitamin D3, Melatonin, vitamin E200.   FAMILY H ISTORY: Positive for diabetes, heart disease, and arthritis.  SOCIAL HISTORY: She does not smoke or drink. She is retired.  REVIEW OF SYSTEMS: Positive for glasses, balance problems, depression, easy bruising, otherwise negative for 14 points.  Sarah Callahan is an 79 y.o. female.   Chief Complaint: STS RRF HPI: see above  Past Medical History  Diagnosis Date  . Parkinson disease   . Diabetes mellitus     diet controlled  . GERD (gastroesophageal reflux disease)   . Hypothyroidism   . PONV (postoperative nausea and vomiting)   . Wears glasses   . Arthritis     Past Surgical History  Procedure Laterality Date  . Abdominal hysterectomy    . Hemorrhoid surgery    . Hand surgery    . Foot surgery  2009    hammer toes  . Tonsillectomy    . Appendectomy    . Eye surgery      both cataracts  . Colonoscopy    . Carpal tunnel release      both    Family History  Problem Relation Age of Onset  . Heart disease Mother   . Heart disease Father   . Heart  disease Brother   . Heart disease Maternal Grandmother   . Heart disease Maternal Grandfather   . Heart disease Paternal Grandmother   . Heart disease Paternal Grandfather    Social History:  reports that she has never smoked. She has never used smokeless tobacco. She reports that she does not drink alcohol or use illicit drugs.  Allergies:  Allergies  Allergen Reactions  . Cephalexin Other (See Comments)    Gets UTI after taking  . Codeine Itching  . E-Mycin (Erythromycin Base) Nausea And Vomiting  . Lidocaine Other (See Comments)    Feels hot, turns red  . Penicillins Hives  . Sulfa Drugs Cross Reactors Hives  . Mercury Rash    Medications Prior to Admission  Medication Sig Dispense Refill  . carbidopa-levodopa (SINEMET IR) 25-100 MG per tablet TAKE 1/2 TABLET TWICE A DAY X2WEEKS,1TAB TWICE A DAY X2WEEKS, THEN 1TAB 3 TIMES A DAY  270 tablet  0  . cholecalciferol (VITAMIN D) 1000 UNITS tablet Take 1,000 Units by mouth daily. 4000      . furosemide (LASIX) 20 MG tablet Take 20 mg by mouth daily.       Marland Kitchen levothyroxine (SYNTHROID, LEVOTHROID) 75 MCG tablet Take 75 mcg by mouth daily.      Marland Kitchen omeprazole (PRILOSEC) 40 MG capsule Take 40 mg by mouth daily.      Marland Kitchen PARoxetine (PAXIL) 10 MG tablet  Take 10 mg by mouth every morning.        No results found for this or any previous visit (from the past 48 hour(s)).  No results found.   Pertinent items are noted in HPI.  There were no vitals taken for this visit.  General appearance: alert, cooperative and appears stated age Head: Normocephalic, without obvious abnormality Neck: no JVD Resp: clear to auscultation bilaterally Cardio: regular rate and rhythm, S1, S2 normal, no murmur, click, rub or gallop GI: soft, non-tender; bowel sounds normal; no masses,  no organomegaly Extremities: extremities normal, atraumatic, no cyanosis or edema Pulses: 2+ and symmetric Skin: Skin color, texture, turgor normal. No rashes or  lesions Neurologic: Grossly normal Incision/Wound: na  Assessment/Plan DX: STS RRF  She would like to go ahead and have this surgically released rather than injection. The pre, peri and post op course are discussed along with risks and complications.  She is aware there is no guarantee with surgery, possibility of infection, recurrence, injury to arteries, nerves and tendons, incomplete relief of symptoms and dystrophy.  She is scheduled at her convenience for release A-1 pulley right ring finger as an outpatient under regional anesthesia.  [name] R 04/30/2013, 7:31 AM

## 2013-04-30 NOTE — Op Note (Signed)
NAMELOREN, VICENS              ACCOUNT NO.:  [id number]  MEDICAL RECORD NO.:  [id number]  LOCATION:                                 FACILITY:  PHYSICIAN:  Cindee Salt, M.D.       DATE OF BIRTH:  1931/05/27  DATE OF PROCEDURE:  04/30/2013 DATE OF DISCHARGE:                              OPERATIVE REPORT   PREOPERATIVE DIAGNOSIS:  Stenosing tenosynovitis, right ring finger.  POSTOPERATIVE DIAGNOSIS:  Stenosing tenosynovitis, right ring finger.  OPERATION:  Release of A1 pulley, right ring finger.  SURGEON:  Cindee Salt, MD  ANESTHESIA:  Forearm-based IV regional local infiltration.  ANESTHESIOLOGIST:  Dr. Jean Rosenthal.  HISTORY:  The patient is an 79 year old female with a history of triggering of multiple fingers.  She has elected to undergo surgical release of the right ring finger.  She does not desire injections.  She is aware of risks and complications including infection; recurrence of injury to arteries, nerves, tendons; incomplete relief of symptoms, dystrophy.  In the preoperative area, the patient was seen.  The extremity was marked by both patient and surgeon.  Antibiotic given.  DESCRIPTION OF PROCEDURE:  The patient was brought to the operating room where forearm-based IV regional anesthetic was carried out without difficulty.  She was prepped using ChloraPrep, supine position with the right arm free.  A 3-minute dry time was allowed.  Time-out taken confirming patient and procedure.  An oblique incision was made over the A1 pulley of the right ring finger carried down through subcutaneous tissue.  Bleeders were electrocauterized with bipolar.  The A1 pulley was identified.  This was released on its radial aspect placing retractors protecting neurovascular bundles radially and ulnarly.  The A1 pulley was found to be markedly thickened.  A small incision was made centrally in A2.  The finger was placed through a full range motion.  No further triggering was noted.   The finger was manipulated to try to correct the flexion deformity she had to the PIP joint.  The wound was copiously irrigated with saline.  The skin was closed with 4-0 Vicryl Rapide sutures.  Local infiltration was given with 0.25% Marcaine without epinephrine, 5 mL being used.  A sterile compressive dressing was applied.  On deflation of the tourniquet, all fingers immediately pinked.  She was taken to the recovery room for observation in satisfactory condition.  She will be discharged on Percocet.          ______________________________ Cindee Salt, M.D.     GK/MEDQ  D:  04/30/2013  T:  04/30/2013  Job:  [id number]

## 2013-04-30 NOTE — Anesthesia Procedure Notes (Addendum)
Procedure Name: MAC Date/Time: 04/30/2013 8:38 AM Performed by: Burna Cash Pre-anesthesia Checklist: Patient identified, Emergency Drugs available, Suction available, Patient being monitored and Timeout performed Patient Re-evaluated:Patient Re-evaluated prior to inductionOxygen Delivery Method: Simple face mask   Anesthesia Regional Block:  Bier block (IV Regional)  Pre-Anesthetic Checklist: ,, timeout performed, Correct Patient, Correct Site, Correct Laterality, Correct Procedure,, site marked, surgical consent,, at surgeon's request Needles:  Injection technique: Single-shot  Needle Type: Other      Needle Gauge: 20 and 20 G    Additional Needles: Bier block (IV Regional) Narrative:   Performed by: Personally   Bier block (IV Regional)

## 2013-04-30 NOTE — Transfer of Care (Signed)
Immediate Anesthesia Transfer of Care Note  Patient: Sarah Callahan  Procedure(s) Performed: Procedure(s): RELEASE TRIGGER FINGER/A-1 PULLEY RIGHT RING FINGER (Right)  Patient Location: PACU  Anesthesia Type:Bier block  Level of Consciousness: awake, alert  and oriented  Airway & Oxygen Therapy: Patient Spontanous Breathing  Post-op Assessment: Report given to PACU RN and Post -op Vital signs reviewed and stable  Post vital signs: Reviewed and stable  Complications: No apparent anesthesia complications

## 2013-04-30 NOTE — Brief Op Note (Signed)
04/30/2013  8:55 AM  PATIENT:  Sarah Callahan  79 y.o. female  PRE-OPERATIVE DIAGNOSIS:  STENOSING TENOSYNOVITIS RIGHT RING FINGER  POST-OPERATIVE DIAGNOSIS:  stenosoing tenosynovitis right ring finger  PROCEDURE:  Procedure(s): RELEASE TRIGGER FINGER/A-1 PULLEY RIGHT RING FINGER (Right)  SURGEON:  Surgeon(s) and Role:    * Nicki Reaper, MD - Primary  PHYSICIAN ASSISTANT:   ASSISTANTS: none   ANESTHESIA:   local and regional  EBL:     BLOOD ADMINISTERED:none  DRAINS: none   LOCAL MEDICATIONS USED:  MARCAINE     SPECIMEN:  No Specimen  DISPOSITION OF SPECIMEN:  N/A  COUNTS:  YES  TOURNIQUET:   Total Tourniquet Time Documented: Forearm (Right) - 15 minutes Total: Forearm (Right) - 15 minutes   DICTATION: .Other Dictation: Dictation Number [id number]  PLAN OF CARE: Discharge to home after PACU  PATIENT DISPOSITION:  PACU - hemodynamically stable.

## 2013-04-30 NOTE — Anesthesia Preprocedure Evaluation (Signed)
Anesthesia Evaluation  Patient identified by MRN, date of birth, ID band Patient awake    Reviewed: Allergy & Precautions, H&P , NPO status , Patient's Chart, lab work & pertinent test results  History of Anesthesia Complications (+) PONV  Airway Mallampati: I TM Distance: >3 FB Neck ROM: Full    Dental  (+) Teeth Intact and Dental Advisory Given   Pulmonary neg pulmonary ROS,  breath sounds clear to auscultation  Pulmonary exam normal       Cardiovascular Rhythm:Regular Rate:Normal     Neuro/Psych parkinson's    GI/Hepatic Neg liver ROS, GERD-  Medicated and Controlled,  Endo/Other  diabetes (diet controlled), Well Controlled, Type 2Hypothyroidism   Renal/GU negative Renal ROS     Musculoskeletal   Abdominal   Peds  Hematology   Anesthesia Other Findings   Reproductive/Obstetrics                           Anesthesia Physical Anesthesia Plan  ASA: III  Anesthesia Plan: Bier Block and MAC   Post-op Pain Management:    Induction:   Airway Management Planned: Natural Airway and Simple Face Mask  Additional Equipment:   Intra-op Plan:   Post-operative Plan:   Informed Consent: I have reviewed the patients History and Physical, chart, labs and discussed the procedure including the risks, benefits and alternatives for the proposed anesthesia with the patient or authorized representative who has indicated his/her understanding and acceptance.   Dental advisory given  Plan Discussed with: CRNA and Surgeon  Anesthesia Plan Comments: (Plan routine monitors, IV regional with MAC)        Anesthesia Quick Evaluation

## 2013-04-30 NOTE — Anesthesia Postprocedure Evaluation (Signed)
  Anesthesia Post-op Note  Patient: Sarah Callahan  Procedure(s) Performed: Procedure(s): RELEASE TRIGGER FINGER/A-1 PULLEY RIGHT RING FINGER (Right)  Patient Location: PACU  Anesthesia Type:MAC and Bier block  Level of Consciousness: awake, alert , oriented and patient cooperative  Airway and Oxygen Therapy: Patient Spontanous Breathing  Post-op Pain: none  Post-op Assessment: Post-op Vital signs reviewed, Patient's Cardiovascular Status Stable, Respiratory Function Stable, Patent Airway, No signs of Nausea or vomiting and Pain level controlled  Post-op Vital Signs: Reviewed and stable  Complications: No apparent anesthesia complications

## 2013-05-01 ENCOUNTER — Encounter (HOSPITAL_BASED_OUTPATIENT_CLINIC_OR_DEPARTMENT_OTHER): Payer: Self-pay | Admitting: Orthopedic Surgery

## 2013-05-08 ENCOUNTER — Other Ambulatory Visit: Payer: Self-pay | Admitting: Diagnostic Neuroimaging

## 2013-06-09 ENCOUNTER — Other Ambulatory Visit: Payer: Self-pay | Admitting: Diagnostic Neuroimaging

## 2013-07-10 ENCOUNTER — Other Ambulatory Visit: Payer: Self-pay | Admitting: *Deleted

## 2013-07-10 ENCOUNTER — Ambulatory Visit (INDEPENDENT_AMBULATORY_CARE_PROVIDER_SITE_OTHER): Payer: Medicare Other | Admitting: Diagnostic Neuroimaging

## 2013-07-10 ENCOUNTER — Encounter: Payer: Self-pay | Admitting: Diagnostic Neuroimaging

## 2013-07-10 VITALS — BP 131/68 | HR 87 | Temp 98.9°F | Ht 64.25 in | Wt 129.0 lb

## 2013-07-10 DIAGNOSIS — G20 Parkinson's disease: Secondary | ICD-10-CM

## 2013-07-10 MED ORDER — CARBIDOPA-LEVODOPA 25-100 MG PO TABS: 1.0000 | ORAL_TABLET | Freq: Four times a day (QID) | ORAL | Status: DC

## 2013-07-10 NOTE — Progress Notes (Signed)
[facility]  PATIENT: Sarah Callahan DOB: 1931/10/25  REFERRING CLINICIAN:  HISTORY FROM: patient REASON FOR VISIT: routine follow p   HISTORICAL  CHIEF COMPLAINT:  Chief Complaint  Patient presents with  . Annual Exam    HISTORY OF PRESENT ILLNESS:   UPDATE 07/10/13: Since last visit, patient is noted some wearing off between dosages. She also is noted dystonia in the right foot. This has been happening over the past 2-3 months. She also has had increasing falls and balance problems. Patient is alone for this visit. She is not using a walker or cane.  UPDATE 05/29/12: Doing well. Taking carb/levo TID. No wearing off. No dyskinesias. She is ahppy with current dosing. PT has helped. Uses a cane for distance walking.  UPDATE 12/22/11: Doing better on carb/levo. notices tremor is worse if she misses a dose. Still with balance dif, and has fallen down. Also found that ther gas fireplace was leaking carbon monoide into the home. Now replaced, and her fatigue is better.  PRIOR HPUI: 79 year old right-handed female with history of diabetes, hypothyroidism, osteoporosis, here for evaluation of tremor.   patient reports progressive right upper extremity tremor for the past 1-1/2 years. Tremor is mainly present at rest but sometimes when she holds a cup or utensils. Tremor worsens with stress. Patient also reports unsteady gait and balance, intermittent drooling, and constipation. No significant change in sense of taste or smell. No reports of sleep disturbance or hallucination. No family history of Parkinson's disease.  REVIEW OF SYSTEMS: Full 14 system review of systems performed and notable only for swelling and legs double vision easy bruising joint pain allergy depression on asleep restless legs.  ALLERGIES: Allergies  Allergen Reactions  . Cephalexin Other (See Comments)    Gets UTI after taking  . Codeine Itching  . E-Mycin [Erythromycin Base] Nausea And Vomiting  .  Lidocaine Other (See Comments)    Feels hot, turns red  . Penicillins Hives  . Sulfa Drugs Cross Reactors Hives  . Mercury Rash    HOME MEDICATIONS: Prior to Admission medications   Medication Sig Start Date End Date Taking? Authorizing Provider  ACCU-CHEK AVIVA PLUS test strip  05/27/13  Yes Historical Provider, MD  Blood Glucose Calibration (ACCU-CHEK AVIVA) SOLN  05/27/13  Yes Historical Provider, MD  carbidopa-levodopa (SINEMET IR) 25-100 MG per tablet Take 1 tablet by mouth 4 (four) times daily. 07/10/13  Yes Suanne Marker, MD  cholecalciferol (VITAMIN D) 1000 UNITS tablet Take 1,000 Units by mouth as needed. 4000   Yes Historical Provider, MD  furosemide (LASIX) 20 MG tablet Take 20 mg by mouth daily.    Yes Historical Provider, MD  levothyroxine (SYNTHROID, LEVOTHROID) 75 MCG tablet Take 75 mcg by mouth daily.   Yes Historical Provider, MD  omeprazole (PRILOSEC) 40 MG capsule Take 40 mg by mouth daily.   Yes Historical Provider, MD  PARoxetine (PAXIL) 10 MG tablet Take 10 mg by mouth every morning.   Yes Historical Provider, MD   Outpatient Prescriptions Prior to Visit  Medication Sig Dispense Refill  . cholecalciferol (VITAMIN D) 1000 UNITS tablet Take 1,000 Units by mouth as needed. 4000      . furosemide (LASIX) 20 MG tablet Take 20 mg by mouth daily.       Marland Kitchen levothyroxine (SYNTHROID, LEVOTHROID) 75 MCG tablet Take 75 mcg by mouth daily.      Marland Kitchen omeprazole (PRILOSEC) 40 MG capsule Take 40 mg by mouth daily.      Marland Kitchen  PARoxetine (PAXIL) 10 MG tablet Take 10 mg by mouth every morning.      . carbidopa-levodopa (SINEMET IR) 25-100 MG per tablet Take 1 tablet by mouth 3 (three) times daily.  90 tablet  1  . oxyCODONE-acetaminophen (ROXICET) 5-325 MG per tablet Take 1 tablet by mouth every 4 (four) hours as needed for pain.  30 tablet  0   No facility-administered medications prior to visit.    PAST MEDICAL HISTORY: Past Medical History  Diagnosis Date  . Parkinson disease   .  Diabetes mellitus     diet controlled  . GERD (gastroesophageal reflux disease)   . Hypothyroidism   . PONV (postoperative nausea and vomiting)   . Wears glasses   . Arthritis   . Osteoporosis     PAST SURGICAL HISTORY: Past Surgical History  Procedure Laterality Date  . Abdominal hysterectomy    . Hemorrhoid surgery    . Hand surgery    . Foot surgery  2009    hammer toes  . Tonsillectomy    . Appendectomy    . Eye surgery      both cataracts  . Colonoscopy    . Carpal tunnel release      both  . Trigger finger release Right 04/30/2013    Procedure: RELEASE TRIGGER FINGER/A-1 PULLEY RIGHT RING FINGER;  Surgeon: Nicki Reaper, MD;  Location: [facility];  Service: Orthopedics;  Laterality: Right;    FAMILY HISTORY: Family History  Problem Relation Age of Onset  . Heart disease Mother   . Heart disease Father   . Heart disease Brother   . Heart disease Maternal Grandmother   . Heart disease Maternal Grandfather   . Stroke Maternal Grandfather   . Heart disease Paternal Grandmother   . Heart disease Paternal Grandfather   . Heart attack Paternal Grandfather     SOCIAL HISTORY:  History   Social History  . Marital Status: Married    Spouse Name: N/A    Number of Children: 3  . Years of Education: College   Occupational History  .      n/a   Social History Main Topics  . Smoking status: Never Smoker   . Smokeless tobacco: Never Used  . Alcohol Use: No  . Drug Use: No  . Sexual Activity: Not on file   Other Topics Concern  . Not on file   Social History Narrative   Patient lives at home with her spouse and great-granddaughter.    3 children, 1 deceased    Caffeine Use: 2-3 cups of coffee in the a.m. daily     PHYSICAL EXAM  Filed Vitals:   07/10/13 1413  BP: 131/68  Pulse: 87  Temp: 98.9 F (37.2 C)  TempSrc: Oral  Height: 5' 4.25" (1.632 m)  Weight: 129 lb (58.514 kg)    Not recorded    Body mass index is 21.97  kg/(m^2).  EXAM: General: Patient is awake, alert and in no acute distress.  Well developed and groomed.  POSITIVE MYERSON'S.  MASKED FACIES. Neck: Neck is supple. Cardiovascular: No carotid artery bruits.  Heart is regular rate and rhythm with no murmurs.  Neurologic Exam  Mental Status: Awake, alert.  Language is fluent and comprehension intact. Cranial Nerves: Pupils are equal and reactive to light.  Visual fields are full to confrontation.  Conjugate eye movements are full and symmetric.  Facial sensation and strength are symmetric.  Hearing is intact.  Palate elevated symmetrically  and uvula is midline.  Shoulder shrug is symmetric.  Tongue is midline. SOFT, HOARSE VOICE. Motor: RESTING TREMOR OF RUE.  COGWHEELING IN RUE > LUE.  BRADYKINESIA IN BUE AND BLE.  Full strength in the upper and lower extremities.  No pronator drift. Sensory: Intact and symmetric to light touch Coordination: No ataxia or dysmetria on finger-nose or rapid alternating movement testing. Gait and Station: STOOPED POSTURE.  POOR ARM SWING.  SLOW, SHORT STEPS.  Romberg is negative. TIMED UP AND GO TEST (10FT) 16 SEC. Reflexes: Deep tendon reflexes in the upper and lower extremity are present and symmetric.  BRISK AT KNEES.  ABSENT AT ANKLES.  DIAGNOSTIC DATA (LABS, IMAGING, TESTING) - I reviewed patient records, labs, notes, testing and imaging myself where available.  Lab Results  Component Value Date   HGB 14.3 04/30/2013   HCT 42.0 04/30/2013      Component Value Date/Time   NA 141 04/30/2013 0820   K 4.0 04/30/2013 0820   CL 99 04/30/2013 0820   CO2 30 09/07/2008 1600   GLUCOSE 122* 04/30/2013 0820   BUN 18 04/30/2013 0820   CREATININE 0.70 04/30/2013 0820   CALCIUM 9.8 09/07/2008 1600   GFRNONAA >60 09/07/2008 1600   GFRAA  Value: >60        The eGFR has been calculated using the MDRD equation. This calculation has not been validated in all clinical 09/07/2008 1600   No results found for this basename: CHOL,  HDL, LDLCALC, LDLDIRECT, TRIG, CHOLHDL   No results found for this basename: HGBA1C   No results found for this basename: VITAMINB12   No results found for this basename: TSH    10/26/11 MRI brain -  mild bitemporal atrophy   ASSESSMENT AND PLAN  79 y.o. female with diabetes, hypothyroidism, osteoporosis, with progressive right upper extremity resting tremor. She has cogwheel rigidity, bradykinesia, stooped posture, and Myerson's sign on exam.    Dx: idiopathic Parkinson's disease  PLAN: 1. INCREASE carbidopa/levodopa TO 4X PER DAY (6AM, 10AM, 2PM, 6PM) due to wearing off; next step may include adding azilect vs. entacapone   Meds ordered this encounter  Medications  . carbidopa-levodopa (SINEMET IR) 25-100 MG per tablet    Sig: Take 1 tablet by mouth 4 (four) times daily.    Dispense:  120 tablet    Refill:  12    Return in about 3 months (around 10/09/2013) for with Heide Guile or Andrianna Manalang.    Suanne Marker, MD 07/10/2013, 2:54 PM Certified in Neurology, Neurophysiology and Neuroimaging  [facility] [address], [address] [place], [place] [address] [phone]

## 2013-07-10 NOTE — Patient Instructions (Signed)
Parkinson's Disease Parkinson's disease is a disorder of the central nervous system, which includes the brain and spinal cord. A person with this disease slowly loses the ability to completely control body movements. Within the brain, there is a group of nerve cells (basal ganglia) that help control movement. The basal ganglia are damaged and do not work properly in a person with Parkinson's disease. In addition, the basal ganglia produce and use a brain chemical called dopamine. The dopamine chemical sends messages to other parts of the body to control and coordinate body movements. Dopamine levels are low in a person with Parkinson's disease. If the dopamine levels are low, then the body does not receive the correct messages it needs to move normally.  CAUSES  The exact reason why the basal ganglia get damaged is not known. Some medical researchers have thought that infection, genes, environment, and certain medicines may contribute to the cause.  SYMPTOMS   An early symptom of Parkinson's disease is often an uncontrolled shaking (tremor) of the hands. The tremor will often disappear when the affected hand is consciously used.  As the disease progresses, walking, talking, getting out of a chair, and new movements become more difficult.  Muscles get stiff and movements become slower.  Balance and coordination become harder.  Depression, trouble swallowing, urinary problems, constipation, and sleep problems can occur.  Later in the disease, memory and thought processes may deteriorate. DIAGNOSIS  There are no specific tests to diagnose Parkinson's disease. You may be referred to a neurologist for evaluation. Your caregiver will ask about your medical history, symptoms, and perform a physical exam. Blood tests and imaging tests of your brain may be performed to rule out other diseases. The imaging tests may include an MRI or a CT scan. TREATMENT  The goal of treatment is to relieve symptoms.  Medicines may be prescribed once the symptoms become troublesome. Medicine will not stop the progression of the disease, but medicine can make movement and balance better and help control tremors. Speech and occupational therapy may also be prescribed. Sometimes, surgical treatment of the brain can be done in young people. HOME CARE INSTRUCTIONS  Get regular exercise and rest periods during the day to help prevent exhaustion and depression.  If getting dressed becomes difficult, replace buttons and zippers with Velcro and elastic on your clothing.  Take all medicine as directed by your caregiver.  Install grab bars or railings in your home to prevent falls.  Go to speech or occupational therapy as directed.  Keep all follow-up visits as directed by your caregiver. SEEK MEDICAL CARE IF:  Your symptoms are not controlled with your medicine.  You fall.  You have trouble swallowing or choke on your food. MAKE SURE YOU:  Understand these instructions.  Will watch your condition.  Will get help right away if you are not doing well or get worse. Document Released: 10/20/2000 Document Revised: 04/23/2012 Document Reviewed: 11/22/2011 [employer] Patient Information 2014 [employer].  

## 2013-10-10 ENCOUNTER — Ambulatory Visit (INDEPENDENT_AMBULATORY_CARE_PROVIDER_SITE_OTHER): Payer: Medicare Other | Admitting: Nurse Practitioner

## 2013-10-10 ENCOUNTER — Encounter: Payer: Self-pay | Admitting: Nurse Practitioner

## 2013-10-10 ENCOUNTER — Encounter (INDEPENDENT_AMBULATORY_CARE_PROVIDER_SITE_OTHER): Payer: Self-pay

## 2013-10-10 VITALS — BP 133/84 | HR 68 | Temp 97.4°F | Ht 64.0 in | Wt 128.0 lb

## 2013-10-10 DIAGNOSIS — G20 Parkinson's disease: Secondary | ICD-10-CM

## 2013-10-10 DIAGNOSIS — G20A1 Parkinson's disease without dyskinesia, without mention of fluctuations: Secondary | ICD-10-CM | POA: Insufficient documentation

## 2013-10-10 NOTE — Patient Instructions (Signed)
PLAN:  1. carbidopa/levodopa T4X PER DAY (6AM, 10AM, 2PM, 6PM); next step may include adding azilect vs. entacapone  2. Follow up in 6 months, sooner as needed.

## 2013-10-10 NOTE — Progress Notes (Signed)
PATIENT: Sarah Callahan DOB: 21-Sep-1931   REASON FOR VISIT: follow up for PD HISTORY FROM: patient  HISTORY OF PRESENT ILLNESS: UPDATE 10/10/13 (LL):  Since last visit, she started increasing Sinemet from TID to QID, with good results.  Sometimes she is so busy she forgets to take extra dose.  Dystonia is the same in right foot, but she would rather hold off on starting another medication until she has to.  She has custody of her great-granddaughter who is 7 and has ADHD.  UPDATE 07/10/13 (VP): Since last visit, patient is noted some wearing off between dosages. She also is noted dystonia in the right foot. This has been happening over the past 2-3 months. She also has had increasing falls and balance problems. Patient is alone for this visit. She is not using a walker or cane.  UPDATE 05/29/12: Doing well. Taking carb/levo TID. No wearing off. No dyskinesias. She is ahppy with current dosing. PT has helped. Uses a cane for distance walking.  UPDATE 12/22/11: Doing better on carb/levo. notices tremor is worse if she misses a dose. Still with balance dif, and has fallen down. Also found that ther gas fireplace was leaking carbon monoide into the home. Now replaced, and her fatigue is better.  PRIOR HPUI: 79 year old right-handed female with history of diabetes, hypothyroidism, osteoporosis, here for evaluation of tremor. patient reports progressive right upper extremity tremor for the past 1-1/2 years. Tremor is mainly present at rest but sometimes when she holds a cup or utensils. Tremor worsens with stress. Patient also reports unsteady gait and balance, intermittent drooling, and constipation. No significant change in sense of taste or smell. No reports of sleep disturbance or hallucination. No family history of Parkinson's disease.   REVIEW OF SYSTEMS: Full 14 system review of systems performed and notable only for fatigue, swelling and legs, double vision, easy bruising, easy bleeding, joint pain,  aching muscles, allergies, depression, decreased energy  ALLERGIES: Allergies  Allergen Reactions  . Cephalexin Other (See Comments)    Gets UTI after taking  . Codeine Itching  . E-Mycin [Erythromycin Base] Nausea And Vomiting  . Lidocaine Other (See Comments)    Feels hot, turns red  . Penicillins Hives  . Sulfa Drugs Cross Reactors Hives  . Mercury Rash    HOME MEDICATIONS: Outpatient Prescriptions Prior to Visit  Medication Sig Dispense Refill  . ACCU-CHEK AVIVA PLUS test strip       . Blood Glucose Calibration (ACCU-CHEK AVIVA) SOLN       . carbidopa-levodopa (SINEMET IR) 25-100 MG per tablet Take 1 tablet by mouth 4 (four) times daily.  120 tablet  12  . cholecalciferol (VITAMIN D) 1000 UNITS tablet Take 1,000 Units by mouth as needed. 4000      . furosemide (LASIX) 20 MG tablet Take 20 mg by mouth daily.       Marland Kitchen levothyroxine (SYNTHROID, LEVOTHROID) 75 MCG tablet Take 75 mcg by mouth daily.      Marland Kitchen omeprazole (PRILOSEC) 40 MG capsule Take 40 mg by mouth daily.      Marland Kitchen PARoxetine (PAXIL) 10 MG tablet Take 10 mg by mouth every morning.       No facility-administered medications prior to visit.    PAST MEDICAL HISTORY: Past Medical History  Diagnosis Date  . Parkinson disease   . Diabetes mellitus     diet controlled  . GERD (gastroesophageal reflux disease)   . Hypothyroidism   . PONV (postoperative nausea and vomiting)   .  Wears glasses   . Arthritis   . Osteoporosis     PAST SURGICAL HISTORY: Past Surgical History  Procedure Laterality Date  . Abdominal hysterectomy    . Hemorrhoid surgery    . Hand surgery    . Foot surgery  2009    hammer toes  . Tonsillectomy    . Appendectomy    . Eye surgery      both cataracts  . Colonoscopy    . Carpal tunnel release      both  . Trigger finger release Right 04/30/2013    Procedure: RELEASE TRIGGER FINGER/A-1 PULLEY RIGHT RING FINGER;  Surgeon: Nicki Reaper, MD;  Location: [facility];  Service:  Orthopedics;  Laterality: Right;    FAMILY HISTORY: Family History  Problem Relation Age of Onset  . Heart disease Mother   . Heart disease Father   . Heart disease Brother   . Heart disease Maternal Grandmother   . Heart disease Maternal Grandfather   . Stroke Maternal Grandfather   . Heart disease Paternal Grandmother   . Heart disease Paternal Grandfather   . Heart attack Paternal Grandfather     SOCIAL HISTORY: History   Social History  . Marital Status: Married    Spouse Name: N/A    Number of Children: 3  . Years of Education: College   Occupational History  .      n/a   Social History Main Topics  . Smoking status: Never Smoker   . Smokeless tobacco: Never Used  . Alcohol Use: No  . Drug Use: No  . Sexual Activity: Not on file   Other Topics Concern  . Not on file   Social History Narrative   Patient lives at home with her spouse and great-granddaughter.    3 children, 1 deceased    Caffeine Use: 2-3 cups of coffee in the a.m. daily     PHYSICAL EXAM  Filed Vitals:   10/10/13 1008  BP: 133/84  Pulse: 68  Temp: 97.4 F (36.3 C)  TempSrc: Oral  Height: 5\' 4"  (1.626 m)  Weight: 128 lb (58.06 kg)   Body mass index is 21.96 kg/(m^2).  EXAM:  General: Patient is awake, alert and in no acute distress. Well developed and groomed. POSITIVE MYERSON'S. MASKED FACIES.  Neck: Neck is supple.  Cardiovascular: No carotid artery bruits. Heart is regular rate and rhythm with no murmurs.  Neurologic Exam  Mental Status: Awake, alert. Language is fluent and comprehension intact.  Cranial Nerves: Pupils are equal and reactive to light. Visual fields are full to confrontation. Conjugate eye movements are full and symmetric. Facial sensation and strength are symmetric. Hearing is intact. Palate elevated symmetrically and uvula is midline. Shoulder shrug is symmetric. Tongue is midline. SOFT, HOARSE VOICE.  Motor: RESTING TREMOR OF RUE. COGWHEELING IN RUE > LUE.  BRADYKINESIA IN BUE AND BLE. Full strength in the upper and lower extremities. No pronator drift.  Sensory: Intact and symmetric to light touch  Coordination: No ataxia or dysmetria on finger-nose or rapid alternating movement testing.  Gait and Station: STOOPED POSTURE. POOR ARM SWING. SLOW, SHORT STEPS. Romberg is negative. TIMED UP AND GO TEST (10FT) 16 SEC.  Reflexes: Deep tendon reflexes in the upper and lower extremity are present and symmetric. BRISK AT KNEES. ABSENT AT ANKLES.  DIAGNOSTIC DATA (LABS, IMAGING, TESTING) - I reviewed patient records, labs, notes, testing and imaging myself where available.  Lab Results  Component Value Date  HGB 14.3 04/30/2013   HCT 42.0 04/30/2013      Component Value Date/Time   NA 141 04/30/2013 0820   K 4.0 04/30/2013 0820   CL 99 04/30/2013 0820   CO2 30 09/07/2008 1600   GLUCOSE 122* 04/30/2013 0820   BUN 18 04/30/2013 0820   CREATININE 0.70 04/30/2013 0820   CALCIUM 9.8 09/07/2008 1600   GFRNONAA >60 09/07/2008 1600   GFRAA  Value: >60   09/07/2008 1600   10/26/11 MRI brain - mild bitemporal atrophy   ASSESSMENT AND PLAN 79 y.o. female with diabetes, hypothyroidism, osteoporosis, with progressive right upper extremity resting tremor. She has cogwheel rigidity, bradykinesia, stooped posture, and Myerson's sign on exam.  Dx: idiopathic Parkinson's disease   PLAN:  1. Continue carbidopa/levodopa 4X PER DAY (6AM, 10AM, 2PM, 6PM); next step may include adding azilect vs. entacapone  2. Patient was referred for the [facility] Pharmacogenetic Testing study. 3. Follow up in 6 months, sooner as needed.  Ronal Fear, MSN, NP-C 10/10/2013, 10:24 AM [facility] [address], [address] [place], [place] [address] [phone]  Note: This document was prepared with digital dictation and possible smart phrase technology. Any transcriptional errors that result from this process are unintentional.

## 2013-11-21 ENCOUNTER — Encounter: Payer: Self-pay | Admitting: Nurse Practitioner

## 2013-11-26 ENCOUNTER — Ambulatory Visit
Admission: RE | Admit: 2013-11-26 | Discharge: 2013-11-26 | Disposition: A | Discharge status: Home / Self Care | Payer: Medicare Other | Source: Ambulatory Visit | Attending: Family Medicine | Admitting: Family Medicine

## 2013-11-26 ENCOUNTER — Other Ambulatory Visit: Payer: Self-pay | Admitting: Family Medicine

## 2013-11-26 DIAGNOSIS — R0600 Dyspnea, unspecified: Secondary | ICD-10-CM

## 2013-11-27 ENCOUNTER — Other Ambulatory Visit: Payer: Self-pay

## 2013-11-27 DIAGNOSIS — Z1231 Encounter for screening mammogram for malignant neoplasm of breast: Secondary | ICD-10-CM

## 2013-12-16 ENCOUNTER — Encounter: Payer: Self-pay | Admitting: Cardiology

## 2013-12-16 ENCOUNTER — Ambulatory Visit (INDEPENDENT_AMBULATORY_CARE_PROVIDER_SITE_OTHER): Payer: Medicare Other | Admitting: Cardiology

## 2013-12-16 VITALS — BP 124/68 | HR 68 | Ht 64.0 in | Wt 131.0 lb

## 2013-12-16 DIAGNOSIS — R0989 Other specified symptoms and signs involving the circulatory and respiratory systems: Secondary | ICD-10-CM

## 2013-12-16 DIAGNOSIS — E785 Hyperlipidemia, unspecified: Secondary | ICD-10-CM

## 2013-12-16 DIAGNOSIS — E119 Type 2 diabetes mellitus without complications: Secondary | ICD-10-CM

## 2013-12-16 DIAGNOSIS — Z8679 Personal history of other diseases of the circulatory system: Secondary | ICD-10-CM

## 2013-12-16 DIAGNOSIS — E1142 Type 2 diabetes mellitus with diabetic polyneuropathy: Secondary | ICD-10-CM | POA: Insufficient documentation

## 2013-12-16 DIAGNOSIS — R0609 Other forms of dyspnea: Secondary | ICD-10-CM | POA: Insufficient documentation

## 2013-12-16 DIAGNOSIS — Z8619 Personal history of other infectious and parasitic diseases: Secondary | ICD-10-CM

## 2013-12-16 LAB — CBC WITH DIFFERENTIAL/PLATELET
BASOS PCT: 0.3 % (ref 0.0–3.0)
Basophils Absolute: 0 10*3/uL (ref 0.0–0.1)
EOS PCT: 5.2 % — AB (ref 0.0–5.0)
Eosinophils Absolute: 0.3 10*3/uL (ref 0.0–0.7)
HCT: 38.9 % (ref 36.0–46.0)
Hemoglobin: 12.6 g/dL (ref 12.0–15.0)
Lymphocytes Relative: 27.6 % (ref 12.0–46.0)
Lymphs Abs: 1.5 10*3/uL (ref 0.7–4.0)
MCHC: 32.5 g/dL (ref 30.0–36.0)
MCV: 93.3 fl (ref 78.0–100.0)
MONO ABS: 0.5 10*3/uL (ref 0.1–1.0)
Monocytes Relative: 9.2 % (ref 3.0–12.0)
NEUTROS ABS: 3.2 10*3/uL (ref 1.4–7.7)
NEUTROS PCT: 57.7 % (ref 43.0–77.0)
Platelets: 224 10*3/uL (ref 150.0–400.0)
RBC: 4.18 Mil/uL (ref 3.87–5.11)
RDW: 13.1 % (ref 11.5–14.6)
WBC: 5.6 10*3/uL (ref 4.5–10.5)

## 2013-12-16 NOTE — Progress Notes (Signed)
[facility]. [address], [place]  [address] Phone: [phone] Fax:  [phone]  Date:  12/16/2013   ID:  Sarah Callahan, DOB [birth year], MRN [id number]  PCP:  Vidal Schwalbe, MD   History of Present Illness: Sarah Callahan is a 80 y.o. female here for the evaluation of intermittent dyspnea on exertion. Over the past few months she has noted more increasing shortness of breath. After resting seems to pass within 10-15 minutes but this seems to be happening more frequently. She has cardiac risk factor of diabetes as well as hyperlipidemia. She has been under increased stress due to to family situation. One time when carrying laudrey had left breast pain that now is better. Had rheumatic fever at 80 years old. Left with a heart murmur. Irreg beat cleared up.   BNP was drawn and was 141, mildly elevated. Creatinine is 0.64, hemoglobin A1c 6.3, TSH 1.8. I do not see a CBC.  She told me the story of [name], her 51-year-old great granddaughter that is in her custody. She is a gifted child.   Wt Readings from Last 3 Encounters:  12/16/13 131 lb (59.421 kg)  10/10/13 128 lb (58.06 kg)  07/10/13 129 lb (58.514 kg)     Past Medical History  Diagnosis Date  . Parkinson disease   . Diabetes mellitus     diet controlled  . GERD (gastroesophageal reflux disease)   . Hypothyroidism   . PONV (postoperative nausea and vomiting)   . Wears glasses   . Arthritis   . Osteoporosis   . OA (osteoarthritis of spine)     , Back  . Hypercalcemia   . Peripheral edema   . Dyspepsia   . Hypercholesteremia   . Macular degeneration   . Vitamin D deficiency   . H/O: rheumatic fever   . Osteoporosis   . Thoracic compression fracture   . Pancreatitis     Past Surgical History  Procedure Laterality Date  . Abdominal hysterectomy    . Hemorrhoid surgery    . Hand surgery    . Foot surgery  2009    hammer toes  . Tonsillectomy    . Appendectomy    . Eye surgery      both  cataracts  . Colonoscopy    . Carpal tunnel release      both  . Trigger finger release Right 04/30/2013    Procedure: RELEASE TRIGGER FINGER/A-1 PULLEY RIGHT RING FINGER;  Surgeon: Wynonia Sours, MD;  Location: [facility];  Service: Orthopedics;  Laterality: Right;  . Cataract extraction      Current Outpatient Prescriptions  Medication Sig Dispense Refill  . ACCU-CHEK AVIVA PLUS test strip       . Blood Glucose Calibration (ACCU-CHEK AVIVA) SOLN       . carbidopa-levodopa (SINEMET IR) 25-100 MG per tablet Take 1 tablet by mouth 4 (four) times daily.       . cholecalciferol (VITAMIN D) 1000 UNITS tablet Take 1,000 Units by mouth daily.       . Coenzyme Q10 (COQ-10 PO) Take 1 tablet by mouth 2 (two) times daily.      . Cyanocobalamin (VITAMIN B-12 PO) Take 1 tablet by mouth daily.      . Flaxseed, Linseed, (FLAX SEED OIL PO) Take 1 tablet by mouth daily.      . fluticasone (FLONASE) 50 MCG/ACT nasal spray       . levothyroxine (SYNTHROID, LEVOTHROID)  75 MCG tablet Take 75 mcg by mouth daily.      . Melatonin 3 MG TABS Take 3 mg by mouth daily.      Marland Kitchen omeprazole (PRILOSEC) 40 MG capsule Take 40 mg by mouth daily.      Marland Kitchen PARoxetine (PAXIL) 10 MG tablet Take 10 mg by mouth every morning.      . Thiamine HCl (VITAMIN B-1 PO) Take 1 tablet by mouth daily.      . vitamin E (VITAMIN E) 200 UNIT capsule Take 200 Units by mouth daily.       No current facility-administered medications for this visit.    Allergies:    Allergies  Allergen Reactions  . Cephalexin Other (See Comments)    Gets UTI after taking  . Codeine Itching  . E-Mycin [Erythromycin Base] Nausea And Vomiting  . Elavil [Amitriptyline] Other (See Comments)  . Lidocaine Other (See Comments)    Feels hot, turns red  . Neurontin [Gabapentin] Other (See Comments)    Depression  . Penicillins Hives  . Pravastatin Other (See Comments)    Bone and joint pain  . Sulfa Drugs Cross Reactors Hives  . Tramadol Other  (See Comments)    Muscle pain  . Darvon [Propoxyphene] Rash  . Mercury Rash    Social History:  The patient  reports that she has never smoked. She has never used smokeless tobacco. She reports that she does not drink alcohol or use illicit drugs.   Family History  Problem Relation Age of Onset  . Heart disease Mother   . Heart disease Father   . Heart disease Brother   . Heart disease Maternal Grandmother   . Heart disease Maternal Grandfather   . Stroke Maternal Grandfather   . Heart disease Paternal Grandmother   . Heart disease Paternal Grandfather   . Heart attack Paternal Grandfather     ROS:  Please see the history of present illness.   Positive for dyspnea on exertion, fatigue, anxiety, stress, depression, GERD. Denies any strokelike symptoms, fevers, rash, syncope, bleeding   All other systems reviewed and negative.   PHYSICAL EXAM: VS:  BP 124/68  Pulse 68  Ht 5\' 4"  (1.626 m)  Wt 131 lb (59.421 kg)  BMI 22.47 kg/m2 Well nourished, well developed, in no acute distress HEENT: normal, [place]/AT, EOMI Neck: no JVD, normal carotid upstroke, no bruit Cardiac:  normal S1, S2; RRR; no murmur Lungs:  clear to auscultation bilaterally, no wheezing, rhonchi or rales Abd: soft, nontender, no hepatomegaly, no bruits Ext: no edema, 2+ distal pulses Skin: warm and dry GU: deferred Neuro: no focal abnormalities noted, AAO x 3  EKG:  11/26/13-sinus rhythm, rate 65, nonspecific ST-T wave changes the   ASSESSMENT AND PLAN:  1. Dyspnea on exertion-certainly could be anginal equivalent. Diabetes, coronary artery disease equivalent. I would like to check a pharmacologic nuclear stress test to ensure that she does not have any evidence of high-risk features such as large territory of ischemia.. I will also check an echocardiogram to ensure proper structure and function of her heart. She has a history of rheumatic fever as a child. I do not currently appreciate a significant murmur. I will  also check a CBC to ensure that she does not have any evidence of anemia which could contribute to her dyspnea.  2. Parkinson's mild. 3. Diabetes-currently diet controlled.  Signed, Candee Furbish, MD [employer]  12/16/2013 10:39 AM

## 2013-12-16 NOTE — Patient Instructions (Signed)
Your physician recommends that you continue on your current medications as directed. Please refer to the Current Medication list given to you today.  Your physician recommends that you have labs today: CBC  Your physician has requested that you have a lexiscan myoview. For further information please visit [email or web address]. Please follow instruction sheet, as given.  Your physician has requested that you have an echocardiogram. Echocardiography is a painless test that uses sound waves to create images of your heart. It provides your doctor with information about the size and shape of your heart and how well your heart's chambers and valves are working. This procedure takes approximately one hour. There are no restrictions for this procedure.  Your physician recommends that you follow-up as needed.

## 2013-12-22 ENCOUNTER — Ambulatory Visit
Admission: RE | Admit: 2013-12-22 | Discharge: 2013-12-22 | Disposition: A | Discharge status: Home / Self Care | Payer: Medicare Other | Source: Ambulatory Visit

## 2013-12-22 DIAGNOSIS — Z1231 Encounter for screening mammogram for malignant neoplasm of breast: Secondary | ICD-10-CM

## 2013-12-31 ENCOUNTER — Encounter (HOSPITAL_COMMUNITY): Payer: Medicare Other

## 2014-01-02 ENCOUNTER — Encounter (HOSPITAL_COMMUNITY): Payer: Medicare Other

## 2014-01-06 ENCOUNTER — Other Ambulatory Visit (HOSPITAL_COMMUNITY): Payer: Self-pay | Admitting: Radiology

## 2014-01-07 ENCOUNTER — Encounter: Payer: Self-pay | Admitting: Internal Medicine

## 2014-01-07 ENCOUNTER — Other Ambulatory Visit (HOSPITAL_COMMUNITY): Payer: Self-pay | Admitting: Radiology

## 2014-01-07 ENCOUNTER — Ambulatory Visit (HOSPITAL_COMMUNITY): Payer: Medicare Other | Attending: Cardiology | Admitting: Radiology

## 2014-01-07 DIAGNOSIS — R079 Chest pain, unspecified: Secondary | ICD-10-CM

## 2014-01-07 DIAGNOSIS — R0609 Other forms of dyspnea: Secondary | ICD-10-CM

## 2014-01-07 DIAGNOSIS — R0602 Shortness of breath: Secondary | ICD-10-CM | POA: Insufficient documentation

## 2014-01-07 NOTE — Progress Notes (Signed)
Echocardiogram Performed. 

## 2014-01-13 ENCOUNTER — Ambulatory Visit: Payer: Medicare Other | Admitting: Cardiology

## 2014-01-14 ENCOUNTER — Encounter: Payer: Self-pay | Admitting: Cardiology

## 2014-01-15 ENCOUNTER — Ambulatory Visit (HOSPITAL_COMMUNITY): Payer: Medicare Other | Attending: Cardiovascular Disease | Admitting: Radiology

## 2014-01-15 ENCOUNTER — Encounter: Payer: Self-pay | Admitting: Cardiovascular Disease

## 2014-01-15 VITALS — BP 131/78 | HR 63 | Ht 64.0 in | Wt 130.0 lb

## 2014-01-15 DIAGNOSIS — R0602 Shortness of breath: Secondary | ICD-10-CM

## 2014-01-15 DIAGNOSIS — R0989 Other specified symptoms and signs involving the circulatory and respiratory systems: Principal | ICD-10-CM | POA: Insufficient documentation

## 2014-01-15 DIAGNOSIS — R0609 Other forms of dyspnea: Secondary | ICD-10-CM | POA: Insufficient documentation

## 2014-01-15 MED ORDER — TECHNETIUM TC 99M SESTAMIBI GENERIC - CARDIOLITE
10.8000 | Freq: Once | INTRAVENOUS | Status: AC | PRN
  Administered 2014-01-15: 11 via INTRAVENOUS

## 2014-01-15 MED ORDER — TECHNETIUM TC 99M SESTAMIBI GENERIC - CARDIOLITE
33.0000 | Freq: Once | INTRAVENOUS | Status: AC | PRN
  Administered 2014-01-15: 33 via INTRAVENOUS

## 2014-01-15 MED ORDER — REGADENOSON 0.4 MG/5ML IV SOLN
0.4000 mg | Freq: Once | INTRAVENOUS | Status: AC
  Administered 2014-01-15: 0.4 mg via INTRAVENOUS

## 2014-01-15 NOTE — Progress Notes (Signed)
[facility] [place], [place] [address] [phone]    Cardiology Nuclear Med Study  Sarah Callahan is a 80 y.o. female     MRN : [id number]     DOB: 1930/12/24  Procedure Date: 01/15/2014  Nuclear Med Background Indication for Stress Test:  Evaluation for Ischemia History:  No prior known history of CAD Cardiac Risk Factors: Carotid Disease, Family History - CAD, Lipids and NIDDM  Symptoms:  Dizziness, DOE and SOB   Nuclear Pre-Procedure Caffeine/Decaff Intake:  None NPO After: 7:00pm   Lungs:  clear O2 Sat: 96% on room air. IV 0.9% NS with Angio Cath:  22g  IV Site: R Antecubital  IV Started by:  Crissie Figures, RN  Chest Size (in):  34 Cup Size: D  Height: 5\' 4"  (1.626 m)  Weight:  130 lb (58.968 kg)  BMI:  Body mass index is 22.3 kg/(m^2). Tech Comments:  N/A    Nuclear Med Study 1 or 2 day study: 1 day  Stress Test Type:  Lexiscan  Reading MD: N/A  Order Authorizing Provider:  Candee Furbish, MD  Resting Radionuclide: Technetium 53m Sestamibi  Resting Radionuclide Dose: 11.0 mCi   Stress Radionuclide:  Technetium 75m Sestamibi  Stress Radionuclide Dose: 33.0 mCi           Stress Protocol Rest HR: 63 Stress HR: 94  Rest BP: 131/78 Stress BP: 128/59  Exercise Time (min): n/a METS: n/a   Predicted Max HR: 139 bpm % Max HR: 67.63 bpm Rate Pressure Product: 12032   Dose of Adenosine (mg):  n/a Dose of Lexiscan: 0.4 mg  Dose of Atropine (mg): n/a Dose of Dobutamine: n/a mcg/kg/min (at max HR)  Stress Test Technologist: Irven Baltimore, RN  Nuclear Technologist:  Charlton Amor, CNMT     Rest Procedure:  Myocardial perfusion imaging was performed at rest 45 minutes following the intravenous administration of Technetium 56m Sestamibi. Rest ECG: NSR - Normal EKG  Stress Procedure:  The patient received IV Lexiscan 0.4 mg over 15-seconds.  Technetium 48m Sestamibi injected at 30-seconds. The patient complained that head felt  funny, fatigue, pain back of neck and shoulders, but no chest pain. Quantitative spect images were obtained after a 45 minute delay. Stress ECG: No significant change from baseline ECG  QPS Raw Data Images:  Normal; no motion artifact; normal heart/lung ratio. Stress Images:  Normal homogeneous uptake in all areas of the myocardium. Rest Images:  Normal homogeneous uptake in all areas of the myocardium. Subtraction (SDS):  No evidence of ischemia. Transient Ischemic Dilatation (Normal <1.22):  0.87 Lung/Heart Ratio (Normal <0.45):  0.29  Quantitative Gated Spect Images QGS EDV:  54 ml QGS ESV:  16 ml  Impression Exercise Capacity:  Lexiscan with no exercise. BP Response:  Normal blood pressure response. Clinical Symptoms:  No significant symptoms noted. ECG Impression:  No significant ST segment change suggestive of ischemia. Comparison with Prior Nuclear Study: No previous nuclear study performed  Overall Impression:  Normal stress nuclear study.  LV Ejection Fraction: 71%.  LV Wall Motion:  NL LV Function; NL Wall Motion  [employer]

## 2014-02-20 ENCOUNTER — Encounter: Payer: Self-pay | Admitting: Diagnostic Neuroimaging

## 2014-03-02 ENCOUNTER — Encounter: Payer: Self-pay | Admitting: Diagnostic Neuroimaging

## 2014-03-02 ENCOUNTER — Ambulatory Visit (INDEPENDENT_AMBULATORY_CARE_PROVIDER_SITE_OTHER): Payer: Medicare Other | Admitting: Diagnostic Neuroimaging

## 2014-03-02 VITALS — BP 121/78 | HR 81 | Ht 63.0 in | Wt 129.0 lb

## 2014-03-02 DIAGNOSIS — G20 Parkinson's disease: Secondary | ICD-10-CM

## 2014-03-02 NOTE — Progress Notes (Signed)
PATIENT: Sarah Callahan DOB: 24-Aug-1931   REASON FOR VISIT: follow up for PD HISTORY FROM: patient  HISTORY OF PRESENT ILLNESS:  UPDATE 03/02/14: Since last visit, she notes that her PD symptoms are more significant in AM (with dose 1 and 2) but sig improve with doses 3 and 4. Now having intermittent dystonic posturing of left foot (previously only in right foot). Some restless legs at night. No falls. Uses a scooter or shopping cart for longer distances. Able to get around at home holding on to walls etc.   UPDATE 10/10/13 (LL):  Since last visit, she started increasing Sinemet from TID to QID, with good results.  Sometimes she is so busy she forgets to take extra dose.  Dystonia is the same in right foot, but she would rather hold off on starting another medication until she has to.  She has custody of her great-granddaughter who is 7 and has ADHD.  UPDATE 07/10/13 (VP): Since last visit, patient is noted some wearing off between dosages. She also is noted dystonia in the right foot. This has been happening over the past 2-3 months. She also has had increasing falls and balance problems. Patient is alone for this visit. She is not using a walker or cane.   UPDATE 05/29/12: Doing well. Taking carb/levo TID. No wearing off. No dyskinesias. She is ahppy with current dosing. PT has helped. Uses a cane for distance walking.   UPDATE 12/22/11: Doing better on carb/levo. notices tremor is worse if she misses a dose. Still with balance dif, and has fallen down. Also found that ther gas fireplace was leaking carbon monoide into the home. Now replaced, and her fatigue is better.   PRIOR HPI: 80 year old right-handed female with history of diabetes, hypothyroidism, osteoporosis, here for evaluation of tremor. patient reports progressive right upper extremity tremor for the past 1-1/2 years. Tremor is mainly present at rest but sometimes when she holds a cup or utensils. Tremor worsens with stress. Patient  also reports unsteady gait and balance, intermittent drooling, and constipation. No significant change in sense of taste or smell. No reports of sleep disturbance or hallucination. No family history of Parkinson's disease.   REVIEW OF SYSTEMS: Full 14 system review of systems performed and notable only for fatigue runny nose double vision leg swelling cold intolerance constipation joint pain joint swelling tremor he bruising and bleeding depression.    ALLERGIES: Allergies  Allergen Reactions  . Cephalexin Other (See Comments)    Gets UTI after taking  . Codeine Itching  . E-Mycin [Erythromycin Base] Nausea And Vomiting  . Elavil [Amitriptyline] Other (See Comments)  . Lidocaine Other (See Comments)    Feels hot, turns red  . Neurontin [Gabapentin] Other (See Comments)    Depression  . Penicillins Hives  . Pravastatin Other (See Comments)    Bone and joint pain  . Sulfa Drugs Cross Reactors Hives  . Tramadol Other (See Comments)    Muscle pain  . Darvon [Propoxyphene] Rash  . Mercury Rash    HOME MEDICATIONS: Outpatient Prescriptions Prior to Visit  Medication Sig Dispense Refill  . ACCU-CHEK AVIVA PLUS test strip       . Blood Glucose Calibration (ACCU-CHEK AVIVA) SOLN       . carbidopa-levodopa (SINEMET IR) 25-100 MG per tablet Take 1 tablet by mouth 4 (four) times daily.       . cholecalciferol (VITAMIN D) 1000 UNITS tablet Take 1,000 Units by mouth daily.       Marland Kitchen  Coenzyme Q10 (COQ-10 PO) Take 1 tablet by mouth 2 (two) times daily.      . Cyanocobalamin (VITAMIN B-12 PO) Take 1 tablet by mouth daily.      . Flaxseed, Linseed, (FLAX SEED OIL PO) Take 1 tablet by mouth daily.      . fluticasone (FLONASE) 50 MCG/ACT nasal spray       . levothyroxine (SYNTHROID, LEVOTHROID) 75 MCG tablet Take 75 mcg by mouth daily.      . Melatonin 3 MG TABS Take 3 mg by mouth daily.      Marland Kitchen omeprazole (PRILOSEC) 40 MG capsule Take 40 mg by mouth daily.      . Thiamine HCl (VITAMIN B-1 PO)  Take 1 tablet by mouth daily.      . vitamin E (VITAMIN E) 200 UNIT capsule Take 200 Units by mouth daily.      Marland Kitchen PARoxetine (PAXIL) 10 MG tablet Take 10 mg by mouth every morning.       No facility-administered medications prior to visit.    PAST MEDICAL HISTORY: Past Medical History  Diagnosis Date  . Parkinson disease   . Diabetes mellitus     diet controlled  . GERD (gastroesophageal reflux disease)   . Hypothyroidism   . PONV (postoperative nausea and vomiting)   . Wears glasses   . Arthritis   . Osteoporosis   . OA (osteoarthritis of spine)     , Back  . Hypercalcemia   . Peripheral edema   . Dyspepsia   . Hypercholesteremia   . Macular degeneration   . Vitamin D deficiency   . H/O: rheumatic fever   . Osteoporosis   . Thoracic compression fracture   . Pancreatitis     PAST SURGICAL HISTORY: Past Surgical History  Procedure Laterality Date  . Abdominal hysterectomy    . Hemorrhoid surgery    . Hand surgery    . Foot surgery  2009    hammer toes  . Tonsillectomy    . Appendectomy    . Eye surgery      both cataracts  . Colonoscopy    . Carpal tunnel release      both  . Trigger finger release Right 04/30/2013    Procedure: RELEASE TRIGGER FINGER/A-1 PULLEY RIGHT RING FINGER;  Surgeon: Wynonia Sours, MD;  Location: [facility];  Service: Orthopedics;  Laterality: Right;  . Cataract extraction      FAMILY HISTORY: Family History  Problem Relation Age of Onset  . Heart disease Mother   . Heart disease Father   . Heart disease Brother   . Heart disease Maternal Grandmother   . Heart disease Maternal Grandfather   . Stroke Maternal Grandfather   . Heart disease Paternal Grandmother   . Heart disease Paternal Grandfather   . Heart attack Paternal Grandfather     SOCIAL HISTORY: History   Social History  . Marital Status: Married    Spouse Name: N/A    Number of Children: 3  . Years of Education: College   Occupational History  .       n/a   Social History Main Topics  . Smoking status: Never Smoker   . Smokeless tobacco: Never Used  . Alcohol Use: No  . Drug Use: No  . Sexual Activity: Not on file   Other Topics Concern  . Not on file   Social History Narrative   Patient lives at home with her spouse and great-granddaughter.  3 children, 1 deceased    Caffeine Use: 2-3 cups of coffee in the a.m. daily     PHYSICAL EXAM  Filed Vitals:   03/02/14 1200  BP: 121/78  Pulse: 81  Height: 5\' 3"  (1.6 m)  Weight: 129 lb (58.514 kg)   Body mass index is 22.86 kg/(m^2).  EXAM:  General: Patient is awake, alert and in no acute distress. Well developed and groomed. POSITIVE MYERSON'S. MASKED FACIES.  Neck: Neck is supple.  Cardiovascular: No carotid artery bruits. Heart is regular rate and rhythm with no murmurs.  Neurologic Exam  Mental Status: Awake, alert. Language is fluent and comprehension intact.  Cranial Nerves: Pupils are equal and reactive to light. Visual fields are full to confrontation. Conjugate eye movements are full and symmetric. Facial sensation and strength are symmetric. Hearing is intact. Palate elevated symmetrically and uvula is midline. Shoulder shrug is symmetric. Tongue is midline. SOFT, HOARSE VOICE.  Motor: RESTING TREMOR OF RUE. COGWHEELING IN RUE > LUE. BRADYKINESIA IN BUE AND BLE. Full strength in the upper and lower extremities. No pronator drift.  Sensory: Intact and symmetric to light touch  Coordination: No ataxia or dysmetria on finger-nose or rapid alternating movement testing.  Gait and Station: STOOPED POSTURE. KYPHOSIS. POOR ARM SWING. SLOW, SHORT STEPS. Romberg is negative. Reflexes: Deep tendon reflexes in the upper and lower extremity are present and symmetric. ABSENT AT ANKLES.  DIAGNOSTIC DATA (LABS, IMAGING, TESTING) - I reviewed patient records, labs, notes, testing and imaging myself where available.  Lab Results  Component Value Date   WBC 5.6 12/16/2013    HGB 12.6 12/16/2013   HCT 38.9 12/16/2013   MCV 93.3 12/16/2013   PLT 224.0 12/16/2013      Component Value Date/Time   NA 141 04/30/2013 0820   K 4.0 04/30/2013 0820   CL 99 04/30/2013 0820   CO2 30 09/07/2008 1600   GLUCOSE 122* 04/30/2013 0820   BUN 18 04/30/2013 0820   CREATININE 0.70 04/30/2013 0820   CALCIUM 9.8 09/07/2008 1600   GFRNONAA >60 09/07/2008 1600   GFRAA  Value: >60   09/07/2008 1600    10/26/11 MRI brain - mild bitemporal atrophy    ASSESSMENT AND PLAN 80 y.o. female with diabetes, hypothyroidism, osteoporosis, with progressive right upper extremity resting tremor. She has cogwheel rigidity, bradykinesia, stooped posture, and Myerson's sign on exam.   Dx: idiopathic Parkinson's disease   PLAN:  1. INCREASE carbidopa/levodopa to 1.5 / 1.5 / 1 / 1 (total 5 tabs per day); may increase to 1.5 tabs QID later if needed (6AM, 10AM, 2PM, 6PM);  2. next step may include adding azilect vs. entacapone vs. neupro patch  Return in about 6 months (around 09/01/2014).    Penni Bombard, MD 2/54/2706, 23:76 PM Certified in Neurology, Neurophysiology and Neuroimaging  [facility] [address], [place], [place] [address] [phone]

## 2014-03-02 NOTE — Patient Instructions (Signed)
Increase carbidopa/levodopa to 1.5 tabs with 1st 2 doses of day; keep 1 tab dose for last 2 doses of day.

## 2014-04-10 ENCOUNTER — Ambulatory Visit: Payer: Medicare Other | Admitting: Diagnostic Neuroimaging

## 2014-06-19 ENCOUNTER — Other Ambulatory Visit: Payer: Self-pay | Admitting: Family Medicine

## 2014-06-19 ENCOUNTER — Ambulatory Visit
Admission: RE | Admit: 2014-06-19 | Discharge: 2014-06-19 | Disposition: A | Discharge status: Home / Self Care | Payer: Medicare Other | Source: Ambulatory Visit | Attending: Family Medicine | Admitting: Family Medicine

## 2014-06-19 DIAGNOSIS — M25552 Pain in left hip: Secondary | ICD-10-CM

## 2014-06-19 DIAGNOSIS — M25562 Pain in left knee: Secondary | ICD-10-CM

## 2014-07-15 ENCOUNTER — Other Ambulatory Visit: Payer: Self-pay | Admitting: Diagnostic Neuroimaging

## 2014-07-15 NOTE — Telephone Encounter (Signed)
Last OV note says:  PLAN:  1. INCREASE carbidopa/levodopa to 1.5 / 1.5 / 1 / 1 (total 5 tabs per day

## 2014-07-17 ENCOUNTER — Other Ambulatory Visit: Payer: Self-pay | Admitting: Diagnostic Neuroimaging

## 2014-09-04 ENCOUNTER — Ambulatory Visit (INDEPENDENT_AMBULATORY_CARE_PROVIDER_SITE_OTHER): Payer: Medicare Other | Admitting: Diagnostic Neuroimaging

## 2014-09-04 ENCOUNTER — Encounter: Payer: Self-pay | Admitting: Diagnostic Neuroimaging

## 2014-09-04 VITALS — BP 117/67 | HR 67 | Temp 97.0°F | Ht 66.0 in | Wt 128.0 lb

## 2014-09-04 DIAGNOSIS — G20 Parkinson's disease: Secondary | ICD-10-CM

## 2014-09-04 NOTE — Patient Instructions (Signed)
I will setup physical therapy. 

## 2014-09-04 NOTE — Progress Notes (Signed)
PATIENT: Renia Mikelson DOB: 01/05/1931   REASON FOR VISIT: follow up for PD HISTORY FROM: patient  Chief Complaint  Patient presents with  . Gait Problem     HISTORY OF PRESENT ILLNESS:  UPDATE 09/04/14 (VRP): Since last visit, feels better. Taking carb/levo (1.5/1.5/1/1) and doing better. Still with tremor, slowness, stiffness. Still with balance diff. No falls. Uses a walker at times. Her husband had a "light stroke" in July 2015, with good recovery. He does most of driving. Patient still drives a little (daytime). Patient's grand-daughter (and her boyfriend) and great-grand-daughter live with them. Patient has daughter and another granddaughter who could help her in the future if her health declines.   UPDATE 03/02/14 (VRP): Since last visit, she notes that her PD symptoms are more significant in AM (with dose 1 and 2) but sig improve with doses 3 and 4. Now having intermittent dystonic posturing of left foot (previously only in right foot). Some restless legs at night. No falls. Uses a scooter or shopping cart for longer distances. Able to get around at home holding on to walls etc.   UPDATE 10/10/13 (LL):  Since last visit, she started increasing Sinemet from TID to QID, with good results.  Sometimes she is so busy she forgets to take extra dose.  Dystonia is the same in right foot, but she would rather hold off on starting another medication until she has to.  She has custody of her great-granddaughter who is 7 and has ADHD.  UPDATE 07/10/13 (VP): Since last visit, patient is noted some wearing off between dosages. She also is noted dystonia in the right foot. This has been happening over the past 2-3 months. She also has had increasing falls and balance problems. Patient is alone for this visit. She is not using a walker or cane.   UPDATE 05/29/12: Doing well. Taking carb/levo TID. No wearing off. No dyskinesias. She is ahppy with current dosing. PT has helped. Uses a cane for distance  walking.   UPDATE 12/22/11: Doing better on carb/levo. notices tremor is worse if she misses a dose. Still with balance dif, and has fallen down. Also found that ther gas fireplace was leaking carbon monoide into the home. Now replaced, and her fatigue is better.   PRIOR HPI: 80 year old right-handed female with history of diabetes, hypothyroidism, osteoporosis, here for evaluation of tremor. patient reports progressive right upper extremity tremor for the past 1-1/2 years. Tremor is mainly present at rest but sometimes when she holds a cup or utensils. Tremor worsens with stress. Patient also reports unsteady gait and balance, intermittent drooling, and constipation. No significant change in sense of taste or smell. No reports of sleep disturbance or hallucination. No family history of Parkinson's disease.   REVIEW OF SYSTEMS: Full 14 system review of systems performed and notable only for light sens double vision joint pain stiffness back pain muscle aches RLS freq waking daytime sleepiness numbness allergies depression.   ALLERGIES: Allergies  Allergen Reactions  . Cephalexin Other (See Comments)    Gets UTI after taking  . Codeine Itching  . E-Mycin [Erythromycin Base] Nausea And Vomiting  . Elavil [Amitriptyline] Other (See Comments)  . Lidocaine Other (See Comments)    Feels hot, turns red  . Neurontin [Gabapentin] Other (See Comments)    Depression  . Penicillins Hives  . Pravastatin Other (See Comments)    Bone and joint pain  . Sulfa Drugs Cross Reactors Hives  . Tramadol Other (See Comments)  Muscle pain  . Darvon [Propoxyphene] Rash  . Mercury Rash    HOME MEDICATIONS: Outpatient Prescriptions Prior to Visit  Medication Sig Dispense Refill  . ACCU-CHEK AVIVA PLUS test strip       . Blood Glucose Calibration (ACCU-CHEK AVIVA) SOLN       . carbidopa-levodopa (SINEMET IR) 25-100 MG per tablet Take four doses daily as follows: 1.5 tabs / 1.5 tabs / 1 tab / 1 tab (total 5  tabs per day)  150 tablet  1  . cholecalciferol (VITAMIN D) 1000 UNITS tablet Take 6,000 Units by mouth daily.       . Coenzyme Q10 (COQ-10 PO) Take 1 tablet by mouth 2 (two) times daily.      . Cyanocobalamin (VITAMIN B-12 PO) Take 1 tablet by mouth daily.      . Flaxseed, Linseed, (FLAX SEED OIL PO) Take 1 tablet by mouth daily.      . fluticasone (FLONASE) 50 MCG/ACT nasal spray       . levothyroxine (SYNTHROID, LEVOTHROID) 75 MCG tablet Take 75 mcg by mouth daily.      . Melatonin 3 MG TABS Take 3 mg by mouth daily.      Marland Kitchen omeprazole (PRILOSEC) 40 MG capsule Take 40 mg by mouth daily.      Marland Kitchen PARoxetine (PAXIL) 20 MG tablet Take 20 mg by mouth daily. One tablet daily      . Thiamine HCl (VITAMIN B-1 PO) Take 1 tablet by mouth daily.      . vitamin E (VITAMIN E) 200 UNIT capsule Take 200 Units by mouth daily.       No facility-administered medications prior to visit.    PAST MEDICAL HISTORY: Past Medical History  Diagnosis Date  . Parkinson disease   . Diabetes mellitus     diet controlled  . GERD (gastroesophageal reflux disease)   . Hypothyroidism   . PONV (postoperative nausea and vomiting)   . Wears glasses   . Arthritis   . Osteoporosis   . OA (osteoarthritis of spine)     , Back  . Hypercalcemia   . Peripheral edema   . Dyspepsia   . Hypercholesteremia   . Macular degeneration   . Vitamin D deficiency   . H/O: rheumatic fever   . Osteoporosis   . Thoracic compression fracture   . Pancreatitis     PAST SURGICAL HISTORY: Past Surgical History  Procedure Laterality Date  . Abdominal hysterectomy    . Hemorrhoid surgery    . Hand surgery    . Foot surgery  2009    hammer toes  . Tonsillectomy    . Appendectomy    . Eye surgery      both cataracts  . Colonoscopy    . Carpal tunnel release      both  . Trigger finger release Right 04/30/2013    Procedure: RELEASE TRIGGER FINGER/A-1 PULLEY RIGHT RING FINGER;  Surgeon: Wynonia Sours, MD;  Location: [facility];  Service: Orthopedics;  Laterality: Right;  . Cataract extraction      FAMILY HISTORY: Family History  Problem Relation Age of Onset  . Heart disease Mother   . Heart disease Father   . Heart disease Brother   . Heart disease Maternal Grandmother   . Heart disease Maternal Grandfather   . Stroke Maternal Grandfather   . Heart disease Paternal Grandmother   . Heart disease Paternal Grandfather   . Heart attack Paternal Grandfather  SOCIAL HISTORY: History   Social History  . Marital Status: Married    Spouse Name: N/A    Number of Children: 3  . Years of Education: College   Occupational History  .      n/a   Social History Main Topics  . Smoking status: Never Smoker   . Smokeless tobacco: Never Used  . Alcohol Use: No  . Drug Use: No  . Sexual Activity: Not on file   Other Topics Concern  . Not on file   Social History Narrative   Patient lives at home with her spouse and great-granddaughter.    3 children, 1 deceased    Caffeine Use: 2-3 cups of coffee in the a.m. daily     PHYSICAL EXAM  Filed Vitals:   09/04/14 1120  BP: 117/67  Pulse: 67  Temp: 97 F (36.1 C)  TempSrc: Oral  Height: 5\' 6"  (1.676 m)  Weight: 128 lb (58.06 kg)   Body mass index is 20.67 kg/(m^2).  EXAM:  General: Patient is awake, alert and in no acute distress. Well developed and groomed. POSITIVE MYERSON'S. MASKED FACIES.  Neck: Neck is supple.  Cardiovascular: No carotid artery bruits. Heart is regular rate and rhythm with no murmurs.  Neurologic Exam  Mental Status: Awake, alert. Language is fluent and comprehension intact.  Cranial Nerves: Pupils are equal and reactive to light. Visual fields are full to confrontation. Conjugate eye movements are full and symmetric. Facial sensation and strength are symmetric. Hearing is intact. Palate elevated symmetrically and uvula is midline. Shoulder shrug is symmetric. Tongue is midline. SOFT, HOARSE VOICE.  Motor:  RESTING TREMOR OF RUE. COGWHEELING IN RUE > LUE. BRADYKINESIA IN BUE AND BLE. Full strength in the upper and lower extremities. No pronator drift.  Sensory: Intact and symmetric to light touch  Coordination: No ataxia or dysmetria on finger-nose or rapid alternating movement testing.  Gait and Station: STOOPED POSTURE. SIGNIFICANT KYPHOSIS. POOR ARM SWING. SLOW, SHORT STEPS. Romberg is negative. Reflexes: Deep tendon reflexes in the upper and lower extremity are present and symmetric. ABSENT AT ANKLES.  DIAGNOSTIC DATA (LABS, IMAGING, TESTING) - I reviewed patient records, labs, notes, testing and imaging myself where available.  Lab Results  Component Value Date   WBC 5.6 12/16/2013   HGB 12.6 12/16/2013   HCT 38.9 12/16/2013   MCV 93.3 12/16/2013   PLT 224.0 12/16/2013      Component Value Date/Time   NA 141 04/30/2013 0820   K 4.0 04/30/2013 0820   CL 99 04/30/2013 0820   CO2 30 09/07/2008 1600   GLUCOSE 122* 04/30/2013 0820   BUN 18 04/30/2013 0820   CREATININE 0.70 04/30/2013 0820   CALCIUM 9.8 09/07/2008 1600   GFRNONAA >60 09/07/2008 1600   GFRAA  Value: >60   09/07/2008 1600    10/26/11 MRI brain - mild bitemporal atrophy    ASSESSMENT AND PLAN 80 y.o. female with diabetes, hypothyroidism, osteoporosis, with progressive right upper extremity resting tremor. She has cogwheel rigidity, bradykinesia, stooped posture, and Myerson's sign on exam.   Dx: idiopathic Parkinson's disease   PLAN:  1. Continue carbidopa/levodopa 1.5 / 1.5 / 1 / 1 (total 5 tabs per day); may increase to 1.5 tabs QID later if needed (6AM, 10AM, 2PM, 6PM);  2. next step may include adding azilect vs. entacapone vs. neupro patch 3. PT evaluation for gait and balance  Orders Placed This Encounter  Procedures  . Ambulatory referral to Physical Therapy   Return  in about 3 months (around 12/05/2014).    Penni Bombard, MD 46/56/8127, 51:70 PM Certified in Neurology, Neurophysiology and  Neuroimaging  [facility] [address] [place], [place] [address] [phone]

## 2014-09-21 ENCOUNTER — Telehealth: Payer: Self-pay | Admitting: Diagnostic Neuroimaging

## 2014-09-21 MED ORDER — CARBIDOPA-LEVODOPA 25-100 MG PO TABS: ORAL_TABLET | ORAL | Status: DC

## 2014-09-21 NOTE — Telephone Encounter (Signed)
Rx has been sent.  I called back.  The patient is aware.

## 2014-09-21 NOTE — Telephone Encounter (Signed)
Patient requesting Rx refill for carbidopa-levodopa (SINEMET IR) 25-100 MG per tablet sent to [facility] on [facility].  Please call and advise.

## 2014-10-07 ENCOUNTER — Ambulatory Visit: Payer: Medicare Other | Attending: Diagnostic Neuroimaging | Admitting: Physical Therapy

## 2014-10-07 ENCOUNTER — Encounter: Payer: Self-pay | Admitting: Physical Therapy

## 2014-10-07 DIAGNOSIS — R269 Unspecified abnormalities of gait and mobility: Secondary | ICD-10-CM | POA: Diagnosis not present

## 2014-10-07 DIAGNOSIS — R293 Abnormal posture: Secondary | ICD-10-CM | POA: Diagnosis not present

## 2014-10-08 NOTE — Therapy (Signed)
[facility] [address] [place], [place], [address] Phone: [phone]   Fax:  [phone]  Physical Therapy Evaluation  Patient Details  Name: Sarah Callahan MRN: [id number] Date of Birth: [birth year]  Encounter Date: 10/07/2014      PT End of Session - 10/08/14 0751    Visit Number 1  G1   Number of Visits 17   Date for PT Re-Evaluation 12/07/14   PT Start Time 0850   PT Stop Time 0930   PT Time Calculation (min) 40 min      Past Medical History  Diagnosis Date  . Parkinson disease   . Diabetes mellitus     diet controlled  . GERD (gastroesophageal reflux disease)   . Hypothyroidism   . PONV (postoperative nausea and vomiting)   . Wears glasses   . Arthritis   . Osteoporosis   . OA (osteoarthritis of spine)     , Back  . Hypercalcemia   . Peripheral edema   . Dyspepsia   . Hypercholesteremia   . Macular degeneration   . Vitamin D deficiency   . H/O: rheumatic fever   . Osteoporosis   . Thoracic compression fracture   . Pancreatitis     Past Surgical History  Procedure Laterality Date  . Abdominal hysterectomy    . Hemorrhoid surgery    . Hand surgery    . Foot surgery  2009    hammer toes  . Tonsillectomy    . Appendectomy    . Eye surgery      both cataracts  . Colonoscopy    . Carpal tunnel release      both  . Trigger finger release Right 04/30/2013    Procedure: RELEASE TRIGGER FINGER/A-1 PULLEY RIGHT RING FINGER;  Surgeon: Wynonia Sours, MD;  Location: [place];  Service: Orthopedics;  Laterality: Right;  . Cataract extraction      There were no vitals taken for this visit.  Visit Diagnosis:  Abnormality of gait - Plan: PT plan of care cert/re-cert  Abnormal posture - Plan: PT plan of care cert/re-cert      Subjective Assessment - 10/07/14 0855    Symptoms Pt is an 80 year old female who presents to OP PT status post visit with Dr. Leta Baptist in October.  She presents with  desires to get updated exercise program, especially to target core muscles.  Pt feels that her balance is okay, except when she is overly tired.  She reports having tendency to go backwards.  She reports 1 fall in the past 6 months.  She typically uses cane.   Patient Stated Goals Pt's goal for therapy is to decrease "stooping" posture and update exercises.   Currently in Pain? No/denies  Pt has occasional pain in low back due to arthritis.  Pain medications alleviates pain; lifting aggravates pain.  PT will monitor pain, but will not address as a goal at this time.          [facility] PT Assessment - 10/07/14 0901    Assessment   Medical Diagnosis Parkinson's disease   Onset Date --  diagnosed several years ago   Precautions   Precautions Fall   Balance Screen   Has the patient fallen in the past 6 months Yes   How many times? 1  Fall occurred in home, due to backing into furniture and LOB   Has the patient had a decrease in activity level because of a fear of  falling?  No   Is the patient reluctant to leave their home because of a fear of falling?  No   Home Environment   Living Enviornment Private residence   Living Arrangements Spouse/significant other  Pt has custody of great-granddaughter, in 3rd grade   Available Help at Discharge Family   Type of [place] to enter;Ramped entrance   [place] of Steps 4   [place] One level  4 steps down into garage for [employer] - single point;Walker - 2 wheels;Electric scooter;Tub bench   Prior Function   Level of Independence Independent with basic ADLs;Independent with homemaking with ambulation;Independent with gait;Independent with transfers   Sensation   Light Touch --  Pt reports decreased sensation bilateral feet   Posture/Postural Control   Posture/Postural Control Postural limitations   Postural Limitations Rounded Shoulders;Forward  head;Increased thoracic kyphosis;Posterior pelvic tilt   Strength   Overall Strength Deficits   Right Hip Flexion 3/5   Left Hip Flexion 3+/5   Right Knee Flexion 4/5   Right Knee Extension 5/5   Left Knee Flexion 4/5   Left Knee Extension 5/5   Right Ankle Dorsiflexion 4/5   Left Ankle Dorsiflexion 4/5   Transfers   Transfers Sit to Stand;Stand to Sit   Sit to Stand 6: Modified independent (Device/Increase time);With upper extremity assist;From chair/3-in-1  unable to perform without UE support   Stand to Sit 6: Modified independent (Device/Increase time);With upper extremity assist;To chair/3-in-1   Ambulation/Gait   Ambulation/Gait Yes   Ambulation/Gait Assistance 5: Supervision   Ambulation Distance (Feet) 150 Feet   Assistive device None   Gait Pattern Trunk flexed;Poor foot clearance - left;Poor foot clearance - right  decreased heelstrike bilaterally   Gait velocity 13.94 sec=2.35 ft/sec  backwards gait velocity:  0.66 ft/sec   Stairs Yes   Stairs Assistance 6: Modified independent (Device/Increase time)   Stair Management Technique One rail Right;Step to pattern   Number of Stairs 4   Height of Stairs 6   Standardized Balance Assessment   Standardized Balance Assessment Timed Up and Go Test;Dynamic Gait Index  DGI score 10/24 (<19/24 is fall risk)   Dynamic Gait Index   Level Surface Moderate Impairment   Change in Gait Speed Moderate Impairment   Gait with Horizontal Head Turns Moderate Impairment   Gait with Vertical Head Turns Moderate Impairment   Gait and Pivot Turn Mild Impairment   Step Over Obstacle Moderate Impairment   Step Around Obstacles Mild Impairment   Steps Moderate Impairment   Total Score 10   Timed Up and Go Test   TUG Normal TUG;Manual TUG;Cognitive TUG   Normal TUG (seconds) 17.54   Manual TUG (seconds) 17.82   Cognitive TUG (seconds) 19.73              PT Short Term Goals - 10/08/14 0757    PT SHORT TERM GOAL #1   Title Pt will  be independent with HEP to improve posture, balance, transfers.   Time 4   Period Weeks   Status New   PT SHORT TERM GOAL #2   Title Pt will perform at least 8 of 10 reps of sit<>stand transfers with minimal UE support, for improved transfer efficiency and safety.   Time 4   Period Weeks   Status New   PT SHORT TERM GOAL #3   Title Pt will improve TUG score to less  than or equal to @@@ for decreased fall risk.   Time 4   Period Weeks   Status New   PT SHORT TERM GOAL #4   Title Pt will improve backwards gait velocity to at least 1 ft/sec for improved balance recovery in posterior direction.   Time 4   Period Weeks   Status New          PT Long Term Goals - 10-16-14 0800    PT LONG TERM GOAL #1   Title Pt will verbalize/demonstrate understanding of fall prevention techniques within home environment.   Time 8   Period Weeks   Status New   PT LONG TERM GOAL #2   Title Pt will improve Functional Gait Assessment to at least 15/30 for decreased fall risk.   Time 8   Period Weeks   Status New   PT LONG TERM GOAL #3   Title Pt will improve TUG cognitive score to less than or equal to 15 seconds for decreased fall risk.   Time 8   Period Weeks   Status New   PT LONG TERM GOAL #4   Title Pt will verbalize understanding of proper body mechanics during ADLs and functional activities.   Time 8   Period Weeks   Status New   PT LONG TERM GOAL #5   Title Pt will verbalize plans for continued community fitness upon D/C from PT.   Time 8   Status New          Plan - 10/16/14 0752    Clinical Impression Statement Pt is an 80 year old female who presents to OP PT with reported trunk/core weakness as well as balance difficulty.  She reports having one fall in the past 6 months.  She feels her overall activity level is less due to gatigue.  She presents with tremor, dystonia R foot, difficulty with transfers, posture, and balance.   Pt will benefit from skilled therapeutic  intervention in order to improve on the following deficits Abnormal gait;Decreased balance;Decreased activity tolerance;Decreased mobility;Decreased strength;Difficulty walking;Postural dysfunction   Rehab Potential Good   PT Frequency 2x / week  PT recommends 2x/wk, pt feels she may be able to only do 1x/wk   PT Duration 8 weeks  plus evaluation   PT Treatment/Interventions ADLs/Self Care Home Management;Functional mobility training;Gait training;Therapeutic activities;Therapeutic exercise;Balance training;Neuromuscular re-education;Patient/family education          G-Codes - 10/16/14 0804    Functional Assessment Tool Used TUG (17.54 sec), TUG cognitive (17.82 sec), TUG manual (19.73 sec), gait velocity (2.35 ft/sec forward, 0.55 ft/sec back), Functional Gait Assessment 10/30   Functional Limitation Mobility: Walking and moving around   Mobility: Walking and Moving Around Current Status [phone]) At least 40 percent but less than 60 percent impaired, limited or restricted   Mobility: Walking and Moving Around Goal Status [phone]) At least 20 percent but less than 40 percent impaired, limited or restricted                            Problem List Patient Active Problem List   Diagnosis Date Noted  . Parkinson's disease 03/02/2014  . Dyspnea on exertion 12/16/2013  . Diabetes 12/16/2013  . Hyperlipidemia 12/16/2013  . Varicose veins of lower extremities with other complications 76/22/6333  . Swelling of limb 02/05/2012    Danilyn Cocke W. 10/16/14, 8:10 AM    Mady Haagensen, PT 10/16/2014 8:12 AM Phone: [phone] Fax: [phone]

## 2014-10-15 ENCOUNTER — Ambulatory Visit: Payer: Medicare Other

## 2014-10-15 DIAGNOSIS — R269 Unspecified abnormalities of gait and mobility: Secondary | ICD-10-CM | POA: Diagnosis not present

## 2014-10-15 DIAGNOSIS — R293 Abnormal posture: Secondary | ICD-10-CM

## 2014-10-15 NOTE — Patient Instructions (Signed)
Functional Quadriceps: Sit to Stand   Sit on edge of chair, lean forward until you feel the weight shift to your toes. Stand upright, extending knees fully and then reach out as wide as you can with both arms. Then slowly sit back down, reaching for the chair with your buttocks. Repeat 10 times per day.  http://orth.exer.us/735   BACK: Child's Pose (Sciatica)   Sit in knee-chest position and reach arms forward. Separate knees for comfort. Hold position for 5 breaths. Repeat 3 times.  Copyright  VHI. All rights reserved.    Copyright  VHI. All rights reserved.  Cat / Cow Flow   Inhale, press spine toward ceiling like a Halloween cat. Keeping strength in arms and abdominals, exhale to soften spine through neutral and into cow pose. Open chest and arch back. Initiate movement between cat and cow at tailbone, one vertebrae at a time. Repeat 10 times.  Copyright  VHI. All rights reserved.  Pectoral Stretch With Shoulder Blade Squeeze   Stand, hands clasped behind back. Squeeze shoulder blades. Move only shoulder blades. Keep hands close to body. Do not shrug shoulders. Hold 2 seconds. Repeat 10 times; 3 sets daily.  Copyright  VHI. All rights reserved.  PELVIC TILT: Anterior   Start in slumped position. Roll pelvis forward to arch back. 10 reps per set, 3 sets per day.   Copyright  VHI. All rights reserved.

## 2014-10-15 NOTE — Therapy (Signed)
[facility] [address] [place], [place], [address] Phone: [phone]   Fax:  [phone]  Physical Therapy Treatment  Patient Details  Name: Sarah Callahan MRN: [id number] Date of Birth: [birth year]  Encounter Date: 10/15/2014      PT End of Session - 10/15/14 0938    Visit Number 2   Number of Visits 17   Date for PT Re-Evaluation 12/07/14   Authorization Type [employer] medicare G code every 10th visit   PT Start Time [phone]   PT Stop Time 0935   PT Time Calculation (min) 43 min      Past Medical History  Diagnosis Date  . Parkinson disease   . Diabetes mellitus     diet controlled  . GERD (gastroesophageal reflux disease)   . Hypothyroidism   . PONV (postoperative nausea and vomiting)   . Wears glasses   . Arthritis   . Osteoporosis   . OA (osteoarthritis of spine)     , Back  . Hypercalcemia   . Peripheral edema   . Dyspepsia   . Hypercholesteremia   . Macular degeneration   . Vitamin D deficiency   . H/O: rheumatic fever   . Osteoporosis   . Thoracic compression fracture   . Pancreatitis     Past Surgical History  Procedure Laterality Date  . Abdominal hysterectomy    . Hemorrhoid surgery    . Hand surgery    . Foot surgery  2009    hammer toes  . Tonsillectomy    . Appendectomy    . Eye surgery      both cataracts  . Colonoscopy    . Carpal tunnel release      both  . Trigger finger release Right 04/30/2013    Procedure: RELEASE TRIGGER FINGER/A-1 PULLEY RIGHT RING FINGER;  Surgeon: Wynonia Sours, MD;  Location: [place];  Service: Orthopedics;  Laterality: Right;  . Cataract extraction      There were no vitals taken for this visit.  Visit Diagnosis:  Abnormality of gait  Abnormal posture      Subjective Assessment - 10/15/14 0855    Symptoms Pt is feeling good. No complaints   Currently in Pain? No/denies     Taught HEP to pt and pt performed all exercises as indicated.   Also completed 6 minutes of training on the SCI fit with RPM goal of 47 at level 7 (this is the level pt uses at the gym). Pt was taught the recommendation of pushing 10% greater than self selected pace for people with parkinson's. She verbalized understanding..         PT Education - 10/15/14 [phone]    Education provided Yes   Education Details HEP-see pt instructions   Person(s) Educated Patient   Methods Explanation;Demonstration;Tactile cues;Verbal cues   Comprehension Verbalized understanding;Returned demonstration          PT Short Term Goals - 10/15/14 0943    PT SHORT TERM GOAL #1   Title Pt will be independent with HEP to improve posture, balance, transfers.   Time 4   Period Weeks   Status New   PT SHORT TERM GOAL #2   Title Pt will perform at least 8 of 10 reps of sit<>stand transfers with minimal UE support, for improved transfer efficiency and safety.   Time 4   Period Weeks   Status New   PT SHORT TERM GOAL #3   Title Pt will improve TUG  score to less than or equal to @@@ for decreased fall risk.   Time 4   Period Weeks   Status New   PT SHORT TERM GOAL #4   Title Pt will improve backwards gait velocity to at least 1 ft/sec for improved balance recovery in posterior direction.   Time 4   Period Weeks   Status New          PT Long Term Goals - 10/15/14 0943    PT LONG TERM GOAL #1   Title Pt will verbalize/demonstrate understanding of fall prevention techniques within home environment.   Time 8   Period Weeks   Status New   PT LONG TERM GOAL #2   Title Pt will improve Functional Gait Assessment to at least 15/30 for decreased fall risk.   Time 8   Period Weeks   Status New   PT LONG TERM GOAL #3   Title Pt will improve TUG cognitive score to less than or equal to 15 seconds for decreased fall risk.   Time 8   Period Weeks   Status New   PT LONG TERM GOAL #4   Title Pt will verbalize understanding of proper body mechanics during ADLs and  functional activities.   Time 8   Period Weeks   Status New   PT LONG TERM GOAL #5   Title Pt will verbalize plans for continued community fitness upon D/C from PT.   Time 8   Status New          Plan - 10/15/14 2103    Clinical Impression Statement Pt returned correct demonstration of HEP. Demonstrated difficulty with bed mobility and will benefit form PWR exercises in addition to the posture exercises provided today.   PT Next Visit Plan Ask how HEP is going. Begin teaching PWR bed mobility exercises.                               Problem List Patient Active Problem List   Diagnosis Date Noted  . Parkinson's disease 03/02/2014  . Dyspnea on exertion 12/16/2013  . Diabetes 12/16/2013  . Hyperlipidemia 12/16/2013  . Varicose veins of lower extremities with other complications 12/81/1886  . Swelling of limb 02/05/2012    Delrae Sawyers D 10/15/2014, 9:44 AM

## 2014-10-22 ENCOUNTER — Ambulatory Visit: Payer: Medicare Other

## 2014-10-27 ENCOUNTER — Ambulatory Visit: Payer: Medicare Other | Admitting: Physical Therapy

## 2014-11-03 ENCOUNTER — Ambulatory Visit: Payer: Medicare Other | Admitting: Physical Therapy

## 2014-11-03 ENCOUNTER — Encounter: Payer: Self-pay | Admitting: Physical Therapy

## 2014-11-03 DIAGNOSIS — R269 Unspecified abnormalities of gait and mobility: Secondary | ICD-10-CM | POA: Diagnosis not present

## 2014-11-03 DIAGNOSIS — R293 Abnormal posture: Secondary | ICD-10-CM

## 2014-11-03 NOTE — Therapy (Signed)
[place] [facility] [address] [place], [place], [address] Phone: [phone]   Fax:  [phone]  Physical Therapy Treatment  Patient Details  Name: Sarah Callahan MRN: [id number] Date of Birth: 12/11/1930  Encounter Date: 11/03/2014      PT End of Session - 11/03/14 1455    Visit Number 3   Number of Visits 17   Date for PT Re-Evaluation 12/07/14   Authorization Type [employer] medicare G code every 10th visit   PT Start Time 0857  Pt arrives late   PT Stop Time 0931   PT Time Calculation (min) 34 min   Activity Tolerance Patient tolerated treatment well      Past Medical History  Diagnosis Date  . Parkinson disease   . Diabetes mellitus     diet controlled  . GERD (gastroesophageal reflux disease)   . Hypothyroidism   . PONV (postoperative nausea and vomiting)   . Wears glasses   . Arthritis   . Osteoporosis   . OA (osteoarthritis of spine)     , Back  . Hypercalcemia   . Peripheral edema   . Dyspepsia   . Hypercholesteremia   . Macular degeneration   . Vitamin D deficiency   . H/O: rheumatic fever   . Osteoporosis   . Thoracic compression fracture   . Pancreatitis     Past Surgical History  Procedure Laterality Date  . Abdominal hysterectomy    . Hemorrhoid surgery    . Hand surgery    . Foot surgery  2009    hammer toes  . Tonsillectomy    . Appendectomy    . Eye surgery      both cataracts  . Colonoscopy    . Carpal tunnel release      both  . Trigger finger release Right 04/30/2013    Procedure: RELEASE TRIGGER FINGER/A-1 PULLEY RIGHT RING FINGER;  Surgeon: Wynonia Sours, MD;  Location: [place];  Service: Orthopedics;  Laterality: Right;  . Cataract extraction      There were no vitals taken for this visit.  Visit Diagnosis:  Abnormality of gait  Abnormal posture      Subjective Assessment - 11/03/14 0900    Symptoms I have done a lot of extra housework recently-my  hips are bothering me from overdoing it.  We had a death in the family-not such a good Christmas.  Pt also reports having a fall in the kitchen when foot got caught.   Currently in Pain? Yes   Pain Score 4    Pain Location Hip   Pain Orientation Left;Right   Pain Descriptors / Indicators Aching   Pain Type Acute pain   Pain Onset In the past 7 days   Pain Frequency Intermittent   Aggravating Factors  Walking aggravates   Pain Relieving Factors Resting alleviates                    [facility] Adult PT Treatment/Exercise - 11/03/14 0904    Transfers   Transfers Sit to Stand;Stand to Sit   Sit to Stand 5: Supervision;From elevated surface;With armrests;From chair/3-in-1  10 reps x 2 sets   Sit to Stand Details (indicate cue type and reason) cues for forward lean, increased momentum, and glut/quad squeeze upon standing   Stand to Sit 5: Supervision;To chair/3-in-1;To elevated surface;With armrests  2 sets x 10 reps     Reviewed HEP from last visit:  Sit<>stand as above,  then seated shoulder blade squeeze x 10 reps, seated pelvic tilts x 10 reps.  Pt reports not performing quadruped back exercises, as she is unable to do them on her bed because it's too soft.  Counter balance exercises:  Standing at counter-forward step and weightshift x 10, side step and weightshift x 10 reps, back step and weight shift x 10 reps at counter, with UE support, cues for widening base of support and maintaining balance upon return to starting balance position.  Attempted quarter turning to R and L, 2 reps with UE support and close supervision.           PT Education - 11/03/14 1454    Education provided Yes   Education Details HEP-see pt instructions   Person(s) Educated Patient   Methods Explanation;Demonstration;Verbal cues   Comprehension Verbalized understanding;Returned demonstration          PT Short Term Goals - 10/15/14 0943    PT SHORT TERM GOAL #1   Title Pt will be independent  with HEP to improve posture, balance, transfers.   Time 4   Period Weeks   Status New   PT SHORT TERM GOAL #2   Title Pt will perform at least 8 of 10 reps of sit<>stand transfers with minimal UE support, for improved transfer efficiency and safety.   Time 4   Period Weeks   Status New   PT SHORT TERM GOAL #3   Title Pt will improve TUG score to less than or equal to @@@ for decreased fall risk.   Time 4   Period Weeks   Status New   PT SHORT TERM GOAL #4   Title Pt will improve backwards gait velocity to at least 1 ft/sec for improved balance recovery in posterior direction.   Time 4   Period Weeks   Status New           PT Long Term Goals - 10/15/14 0943    PT LONG TERM GOAL #1   Title Pt will verbalize/demonstrate understanding of fall prevention techniques within home environment.   Time 8   Period Weeks   Status New   PT LONG TERM GOAL #2   Title Pt will improve Functional Gait Assessment to at least 15/30 for decreased fall risk.   Time 8   Period Weeks   Status New   PT LONG TERM GOAL #3   Title Pt will improve TUG cognitive score to less than or equal to 15 seconds for decreased fall risk.   Time 8   Period Weeks   Status New   PT LONG TERM GOAL #4   Title Pt will verbalize understanding of proper body mechanics during ADLs and functional activities.   Time 8   Period Weeks   Status New   PT LONG TERM GOAL #5   Title Pt will verbalize plans for continued community fitness upon D/C from PT.   Time 8   Status New               Plan - 11/03/14 1455    Clinical Impression Statement Pt reports not having difficulty with bed mobility and declines practice of bed mobility today.  Pt has difficulty with initiation of gait during session today and would benefit from further skilled PT to address weightshifiting and balance.   Pt will benefit from skilled therapeutic intervention in order to improve on the following deficits Abnormal gait;Decreased  balance;Decreased activity tolerance;Decreased mobility;Decreased strength;Difficulty walking;Postural dysfunction  Rehab Potential Good   PT Frequency 2x / week   PT Duration 8 weeks   PT Treatment/Interventions ADLs/Self Care Home Management;Functional mobility training;Gait training;Therapeutic activities;Therapeutic exercise;Balance training;Neuromuscular re-education;Patient/family education   PT Next Visit Plan Follow up on HEP; revisit bed mobility/floor transfers, weightshifting and balance.   Consulted and Agree with Plan of Care Patient        Problem List Patient Active Problem List   Diagnosis Date Noted  . Parkinson's disease 03/02/2014  . Dyspnea on exertion 12/16/2013  . Diabetes 12/16/2013  . Hyperlipidemia 12/16/2013  . Varicose veins of lower extremities with other complications 62/56/3893  . Swelling of limb 02/05/2012    Sarah Callahan W. 11/03/2014, 2:59 PM   Mady Haagensen, PT 11/03/2014 3:00 PM Phone: [phone] Fax: [place] [address] [place], [place], [address] Phone: [phone]   Fax:  [phone]

## 2014-11-03 NOTE — Patient Instructions (Signed)
Single Step: Forward / Backward   1.  Lifting foot off floor, take one step slowly forward with right leg. Return to starting position. Repeat 10 times, then switch to left leg 10 times.  2.  Take one step backward and return to start position (facing the counter). Repeat _10__ times per session. Do __1-2__ sessions per day.   Copyright  VHI. All rights reserved.  Single Step: Side   Lifting foot off floor, take one step slowly to left side (facing the counter). Return to starting position. Repeat _10___ times per session. Do _1-2___ sessions per day. Repeat to opposite side.  Copyright  VHI. All rights reserved.

## 2014-11-11 ENCOUNTER — Encounter: Payer: Self-pay | Admitting: Physical Therapy

## 2014-11-11 ENCOUNTER — Ambulatory Visit: Payer: Medicare Other | Attending: Diagnostic Neuroimaging | Admitting: Physical Therapy

## 2014-11-11 DIAGNOSIS — R293 Abnormal posture: Secondary | ICD-10-CM | POA: Insufficient documentation

## 2014-11-11 DIAGNOSIS — R269 Unspecified abnormalities of gait and mobility: Secondary | ICD-10-CM | POA: Diagnosis present

## 2014-11-11 NOTE — Patient Instructions (Signed)

## 2014-11-11 NOTE — Therapy (Signed)
[facility] [address] [place], [place], [address] Phone: [phone]   Fax:  [phone]  Physical Therapy Treatment  Patient Details  Name: Sarah Callahan MRN: [id number] Date of Birth: 09-13-1931  Encounter Date: 11/11/2014      PT End of Session - 11/12/14 1628    Visit Number 4   Number of Visits 17   Date for PT Re-Evaluation 12/07/14   Authorization Type [employer] medicare G code every 10th visit   PT Start Time [phone]   PT Stop Time 1016   PT Time Calculation (min) 40 min   Activity Tolerance Patient tolerated treatment well      Past Medical History  Diagnosis Date  . Parkinson disease   . Diabetes mellitus     diet controlled  . GERD (gastroesophageal reflux disease)   . Hypothyroidism   . PONV (postoperative nausea and vomiting)   . Wears glasses   . Arthritis   . Osteoporosis   . OA (osteoarthritis of spine)     , Back  . Hypercalcemia   . Peripheral edema   . Dyspepsia   . Hypercholesteremia   . Macular degeneration   . Vitamin D deficiency   . H/O: rheumatic fever   . Osteoporosis   . Thoracic compression fracture   . Pancreatitis     Past Surgical History  Procedure Laterality Date  . Abdominal hysterectomy    . Hemorrhoid surgery    . Hand surgery    . Foot surgery  2009    hammer toes  . Tonsillectomy    . Appendectomy    . Eye surgery      both cataracts  . Colonoscopy    . Carpal tunnel release      both  . Trigger finger release Right 04/30/2013    Procedure: RELEASE TRIGGER FINGER/A-1 PULLEY RIGHT RING FINGER;  Surgeon: Wynonia Sours, MD;  Location: [facility];  Service: Orthopedics;  Laterality: Right;  . Cataract extraction      There were no vitals taken for this visit.  Visit Diagnosis:  Abnormality of gait  Abnormal posture      Subjective Assessment - 11/11/14 0938    Symptoms Need to be extra careful with my L elbow-had a fall in the middle of  the night the other night and fell on dollhouse-bad scrape on elbow.   Currently in Pain? No/denies      Objective:  NeuroReeducation:  At counter-reviewed HEP-forward step and weightshift, backward step and weightshift and side step and weightshift x 10 reps each.  Discussed purpose of ex for balance recovery, need for deliberate movement practice.  At counter-practiced turns using side step turning method, marching in place turns; marching in place x 10 reps (2 sets).  Heel-toe raises x 10 reps, single limb stance x 3 reps, 10 seconds with UE support.  Forward step ups x 10 reps at 6 inch step with bilateral UE support for improved lower extremity strength/improved single limb stance.     Self Care:  Provided handout and discussed information on fall prevention, in regards to home environment as well as pt's balance difficulties from Parkinson's.               PT Education - 11/11/14 [phone]    Education provided Yes   Education Details Fall prevention   Person(s) Educated Patient   Methods Explanation;Verbal cues   Comprehension Verbalized understanding  PT Short Term Goals - 10/15/14 0943    PT SHORT TERM GOAL #1   Title Pt will be independent with HEP to improve posture, balance, transfers.   Time 4   Period Weeks   Status New   PT SHORT TERM GOAL #2   Title Pt will perform at least 8 of 10 reps of sit<>stand transfers with minimal UE support, for improved transfer efficiency and safety.   Time 4   Period Weeks   Status New   PT SHORT TERM GOAL #3   Title Pt will improve TUG score to less than or equal to @@@ for decreased fall risk.   Time 4   Period Weeks   Status New   PT SHORT TERM GOAL #4   Title Pt will improve backwards gait velocity to at least 1 ft/sec for improved balance recovery in posterior direction.   Time 4   Period Weeks   Status New           PT Long Term Goals - 10/15/14 0943    PT LONG TERM GOAL #1   Title Pt will  verbalize/demonstrate understanding of fall prevention techniques within home environment.   Time 8   Period Weeks   Status New   PT LONG TERM GOAL #2   Title Pt will improve Functional Gait Assessment to at least 15/30 for decreased fall risk.   Time 8   Period Weeks   Status New   PT LONG TERM GOAL #3   Title Pt will improve TUG cognitive score to less than or equal to 15 seconds for decreased fall risk.   Time 8   Period Weeks   Status New   PT LONG TERM GOAL #4   Title Pt will verbalize understanding of proper body mechanics during ADLs and functional activities.   Time 8   Period Weeks   Status New   PT LONG TERM GOAL #5   Title Pt will verbalize plans for continued community fitness upon D/C from PT.   Time 8   Status New               Plan - 11/12/14 1629    Clinical Impression Statement Pt had a fall in the middle of the night; had long discussion regarding safety within the home as well as fall prevention.  Pt feels that PT is helping posture and weightshifting.  Pt will continue to benefit from further skilled PT to address gait and balance.   Pt will benefit from skilled therapeutic intervention in order to improve on the following deficits Abnormal gait;Decreased balance;Decreased activity tolerance;Decreased mobility;Decreased strength;Difficulty walking;Postural dysfunction   Rehab Potential Good   PT Frequency 2x / week  Pt is preferring to come to therapy 1x/wk   PT Duration 8 weeks   PT Treatment/Interventions ADLs/Self Care Home Management;Functional mobility training;Gait training;Therapeutic activities;Therapeutic exercise;Balance training;Neuromuscular re-education;Patient/family education   PT Next Visit Plan Check STGs; follow up on HEP; revisit bed mobility/floor transfers, weightshifting and balance.   Consulted and Agree with Plan of Care Patient        Problem List Patient Active Problem List   Diagnosis Date Noted  . Parkinson's disease  03/02/2014  . Dyspnea on exertion 12/16/2013  . Diabetes 12/16/2013  . Hyperlipidemia 12/16/2013  . Varicose veins of lower extremities with other complications 47/07/6282  . Swelling of limb 02/05/2012    Annajulia Lewing W. 11/12/2014, 4:38 PM   Mady Haagensen, PT 11/12/2014 4:38 PM Phone: [phone] Fax:  [facility] [address] [place], [place], [address] Phone: [phone]   Fax:  [phone]

## 2014-11-18 ENCOUNTER — Ambulatory Visit: Payer: Medicare Other | Admitting: Physical Therapy

## 2014-11-18 ENCOUNTER — Encounter: Payer: Self-pay | Admitting: Physical Therapy

## 2014-11-18 DIAGNOSIS — R269 Unspecified abnormalities of gait and mobility: Secondary | ICD-10-CM | POA: Diagnosis not present

## 2014-11-18 DIAGNOSIS — R293 Abnormal posture: Secondary | ICD-10-CM

## 2014-11-19 NOTE — Patient Instructions (Signed)
Provided patient with modifed quadruped PWR! Moves exercises:  PWR! Up x 10, PWR! Rock x 10, [place]! Twist x 10, with arms supported at counter, for improved posture, weightshifting, and trunk rotation.  Perform 10 reps each, 1-2 times per day, with the following position:  Feet approximately 12-18 inches away from counter, lean into the counter, with hands supported at counter with neutral back posture.

## 2014-11-19 NOTE — Therapy (Signed)
[facility] [address] [place], [place], [address] Phone: [phone]   Fax:  [phone]  Physical Therapy Treatment  Patient Details  Name: Sarah Callahan MRN: [id number] Date of Birth: 27-Nov-1930 Referring Provider:  Vidal Schwalbe, MD  Encounter Date: 11/18/2014      PT End of Session - 11/19/14 1433    Visit Number 5   Number of Visits 17   Date for PT Re-Evaluation 12/07/14   Authorization Type [employer] medicare G code every 10th visit   PT Start Time 1020   PT Stop Time 1100   PT Time Calculation (min) 40 min   Activity Tolerance Patient tolerated treatment well      Past Medical History  Diagnosis Date  . Parkinson disease   . Diabetes mellitus     diet controlled  . GERD (gastroesophageal reflux disease)   . Hypothyroidism   . PONV (postoperative nausea and vomiting)   . Wears glasses   . Arthritis   . Osteoporosis   . OA (osteoarthritis of spine)     , Back  . Hypercalcemia   . Peripheral edema   . Dyspepsia   . Hypercholesteremia   . Macular degeneration   . Vitamin D deficiency   . H/O: rheumatic fever   . Osteoporosis   . Thoracic compression fracture   . Pancreatitis     Past Surgical History  Procedure Laterality Date  . Abdominal hysterectomy    . Hemorrhoid surgery    . Hand surgery    . Foot surgery  2009    hammer toes  . Tonsillectomy    . Appendectomy    . Eye surgery      both cataracts  . Colonoscopy    . Carpal tunnel release      both  . Trigger finger release Right 04/30/2013    Procedure: RELEASE TRIGGER FINGER/A-1 PULLEY RIGHT RING FINGER;  Surgeon: Wynonia Sours, MD;  Location: [facility];  Service: Orthopedics;  Laterality: Right;  . Cataract extraction      There were no vitals taken for this visit.  Visit Diagnosis:  Abnormality of gait  Abnormal posture      Subjective Assessment - 11/18/14 1021    Symptoms Realized that I am shifting  my weight to make turns in the kitchen, so I am turning better   Currently in Pain? No/denies          [facility] PT Assessment - 11/18/14 1024    Ambulation/Gait   Ambulation/Gait Yes   Gait velocity 14.03 sec=2.33 ft/sec   Dynamic Gait Index   Level Surface Moderate Impairment   Change in Gait Speed Moderate Impairment   Gait with Horizontal Head Turns Mild Impairment   Gait with Vertical Head Turns Mild Impairment   Gait and Pivot Turn Mild Impairment   Step Over Obstacle Moderate Impairment   Step Around Obstacles Mild Impairment   Steps Mild Impairment   Total Score 13   Timed Up and Go Test   Normal TUG (seconds) 17.53  16.24 sec at best   Cognitive TUG (seconds) 17.6       Pt performs discusses HEP, including posture exercises-performs brief reps (2-3 reps) of each exercise-posture exercises, varied direction stepping exercises with pt return demonstrating understanding.  Pt performs modified quadruped PWR! exercises at counter:  PWR! UP x 5 reps for posture, with cues for scapular retraction, forward/back rocking and weightshifing for PWR! Rock x 5 reps,  then PWR! Twist in modified quadruped, with cues for appropriate technique.            [facility] Adult PT Treatment/Exercise - 11/18/14 1024    Transfers   Transfers Sit to Stand;Stand to Sit   Sit to Stand From elevated surface;From chair/3-in-1;6: Modified independent (Device/Increase time)  10 reps each    Sit to Stand Details (indicate cue type and reason) Reviewed cues for technique for sit<>stand; no UE support from mat surface, UE support from chair   Stand to Sit 6: Modified independent (Device/Increase time)                PT Education - 11/19/14 1433    Education provided Yes   Education Details Modified quadruped HEP; plan of care/progress towards goals   Person(s) Educated Patient   Methods Explanation;Demonstration;Handout   Comprehension Verbalized understanding;Returned demonstration           PT Short Term Goals - 11/19/14 1434    PT SHORT TERM GOAL #1   Title Pt will be independent with HEP to improve posture, balance, transfers.   Status Achieved   PT SHORT TERM GOAL #2   Title Pt will perform at least 8 of 10 reps of sit<>stand transfers with minimal UE support, for improved transfer efficiency and safety.   Status Achieved   PT SHORT TERM GOAL #3   Title Pt will improve TUG score to less than or equal to 13.5 sec for decreased fall risk.   Status Not Met  TUG 17.5 sec   PT SHORT TERM GOAL #4   Title Pt will improve backwards gait velocity to at least 1 ft/sec for improved balance recovery in posterior direction.   Status Not Met  backwards gait velocity 0.37ft/sec           PT Long Term Goals - 11/19/14 1436    PT LONG TERM GOAL #1   Title Pt will verbalize/demonstrate understanding of fall prevention techniques within home environment. (Target 12/07/13)   Time 8   Period Weeks   Status New   PT LONG TERM GOAL #2   Title Pt will improve Functional Gait Assessment to at least 15/30 for decreased fall risk.   Time 8   Period Weeks   Status New   PT LONG TERM GOAL #3   Title Pt will improve TUG cognitive score to less than or equal to 15 seconds for decreased fall risk.   Status New   PT LONG TERM GOAL #4   Title Pt will verbalize understanding of proper body mechanics during ADLs and functional activities.   Status New   PT LONG TERM GOAL #5   Title Pt will verbalize plans for continued community fitness upon D/C from PT.   Status New               Plan - 11/19/14 1448    Clinical Impression Statement Pt has met STG #1 and 2.  STG #3, 4, and 5 not met.  She reports and PT notices increased overall awareness in how she moves, with pt incorporating exercises and movement patterns into her daily activities.  Pt would benefit from further skilled PT to address posture, balance, strengthening with emphasis on large amplitude movement patterns.  Pt has  only been seen once per week due to her schedule conflicts.  She requests due to scheduling conflicts to schedule after several weeks, into February.  Pt will likely LTG check,, G-code, and renewal done at  that time.   Pt will benefit from skilled therapeutic intervention in order to improve on the following deficits Abnormal gait;Decreased balance;Decreased activity tolerance;Decreased mobility;Decreased strength;Difficulty walking;Postural dysfunction   Rehab Potential Good   PT Frequency 2x / week   PT Duration 8 weeks   PT Treatment/Interventions ADLs/Self Care Home Management;Functional mobility training;Gait training;Therapeutic activities;Therapeutic exercise;Balance training;Neuromuscular re-education;Patient/family education   PT Next Visit Plan Likely recert/extend LTGs, complete g-code, floor transfers, weightshifting and balance activities        Problem List Patient Active Problem List   Diagnosis Date Noted  . Parkinson's disease 03/02/2014  . Dyspnea on exertion 12/16/2013  . Diabetes 12/16/2013  . Hyperlipidemia 12/16/2013  . Varicose veins of lower extremities with other complications 18/29/9371  . Swelling of limb 02/05/2012    Keiran Sias W. 11/19/2014, 2:52 PM  Mady Haagensen, PT 11/19/2014 2:53 PM Phone: [phone] Fax: [facility] [address] [place], [place], [address] Phone: [phone]   Fax:  [phone]

## 2014-12-07 ENCOUNTER — Ambulatory Visit: Payer: Medicare Other | Attending: Diagnostic Neuroimaging | Admitting: Physical Therapy

## 2014-12-07 ENCOUNTER — Encounter: Payer: Self-pay | Admitting: Diagnostic Neuroimaging

## 2014-12-07 ENCOUNTER — Ambulatory Visit (INDEPENDENT_AMBULATORY_CARE_PROVIDER_SITE_OTHER): Payer: Medicare Other | Admitting: Diagnostic Neuroimaging

## 2014-12-07 VITALS — BP 155/79 | HR 74 | Temp 98.1°F | Ht 66.0 in | Wt 130.4 lb

## 2014-12-07 DIAGNOSIS — G20 Parkinson's disease: Secondary | ICD-10-CM

## 2014-12-07 DIAGNOSIS — R269 Unspecified abnormalities of gait and mobility: Secondary | ICD-10-CM | POA: Insufficient documentation

## 2014-12-07 DIAGNOSIS — R293 Abnormal posture: Secondary | ICD-10-CM | POA: Diagnosis not present

## 2014-12-07 NOTE — Patient Instructions (Signed)
Continuing Exercises: 1.  Therapy Home Exercise program:  Do these exercises DAILY!  -Focus on large, deliberate movements  2.  Aerobic Exercises:  -Nustep or Recumbent bike  -Try to do these 2-3 times per week  -Work up to at least 20-30 minutes  3.  Walking for exercise  -Walk for at least 15-20 minutes  -Focus on best posture, large step length

## 2014-12-07 NOTE — Therapy (Signed)
[facility] [address] [place], [place], [address] Phone: [phone]   Fax:  [phone]  Physical Therapy Treatment  Patient Details  Name: Sarah Callahan MRN: [id number] Date of Birth: [birth year] Referring Provider:  Vidal Schwalbe, MD  Encounter Date: 12/07/2014      PT End of Session - 12/07/14 1233    Visit Number [facility] code every 10th visit   PT Start Time 1151   PT Stop Time 1234   PT Time Calculation (min) 43 min      Past Medical History  Diagnosis Date  . Parkinson disease   . Diabetes mellitus     diet controlled  . GERD (gastroesophageal reflux disease)   . Hypothyroidism   . PONV (postoperative nausea and vomiting)   . Wears glasses   . Arthritis   . Osteoporosis   . OA (osteoarthritis of spine)     , Back  . Hypercalcemia   . Peripheral edema   . Dyspepsia   . Hypercholesteremia   . Macular degeneration   . Vitamin D deficiency   . H/O: rheumatic fever   . Osteoporosis   . Thoracic compression fracture   . Pancreatitis     Past Surgical History  Procedure Laterality Date  . Abdominal hysterectomy    . Hemorrhoid surgery    . Hand surgery    . Foot surgery  2009    hammer toes  . Tonsillectomy    . Appendectomy    . Eye surgery      both cataracts  . Colonoscopy    . Carpal tunnel release      both  . Trigger finger release Right 04/30/2013    Procedure: RELEASE TRIGGER FINGER/A-1 PULLEY RIGHT RING FINGER;  Surgeon: Wynonia Sours, MD;  Location: [facility];  Service: Orthopedics;  Laterality: Right;  . Cataract extraction      There were no vitals taken for this visit.  Visit Diagnosis:  Abnormality of gait  Abnormal posture      Subjective Assessment - 12/07/14 1154    Symptoms Pt has had sinus infection, but is now on Z-Pac for antiobiotics; no falls   Currently in Pain? No/denies          [facility] PT Assessment -  12/07/14 1208    Observation/Other Assessments   Focus on Therapeutic Outcomes (FOTO)  Attempted D/C FOTO, but FOTO went down during survey; unable to capture   Dynamic Gait Index   Level Surface Mild Impairment   Change in Gait Speed Moderate Impairment   Gait with Horizontal Head Turns Mild Impairment   Gait with Vertical Head Turns Mild Impairment   Gait and Pivot Turn Normal   Step Over Obstacle Moderate Impairment   Step Around Obstacles Mild Impairment   Steps Mild Impairment   Total Score 15   Timed Up and Go Test   Normal TUG (seconds) 18.8   Cognitive TUG (seconds) 18.3  2nd trial 18.76 sec      Self Care:  Discussed pt's functional measure scores; discussed that she remains at fall risk.  Reviewed and discussed fall prevention, body mechanics with ADLs.  Practiced lateral weightshifting and stagger stance forward and back weightshifting at counter in relation to performing ADLs such as reaching, washing dishes, washing face.  Provided pt with community fitness information/options for appropriate exercise program upon D/C.  Discussed plans for discharge, including plan for screen in  6 months.                    PT Education - 12-16-14 1232    Education provided Yes   Education Details Reviewed fall prevention, body mechanics with ADLs; provided info on continued fitness upon D/C from PT; plans for discharge-pt in agreement   Person(s) Educated Patient   Methods Explanation;Handout   Comprehension Verbalized understanding          PT Short Term Goals - 11/19/14 1434    PT SHORT TERM GOAL #1   Title Pt will be independent with HEP to improve posture, balance, transfers.   Status Achieved   PT SHORT TERM GOAL #2   Title Pt will perform at least 8 of 10 reps of sit<>stand transfers with minimal UE support, for improved transfer efficiency and safety.   Status Achieved   PT SHORT TERM GOAL #3   Title Pt will improve TUG score to less than or equal to 13.5  sec for decreased fall risk.   Status Not Met  TUG 17.5 sec   PT SHORT TERM GOAL #4   Title Pt will improve backwards gait velocity to at least 1 ft/sec for improved balance recovery in posterior direction.   Status Not Met  backwards gait velocity 0.52ft/sec           PT Long Term Goals - 002/10/[id number]    PT LONG TERM GOAL #1   Title Pt will verbalize/demonstrate understanding of fall prevention techniques within home environment. (Target 22015-02-10   Status Achieved   PT LONG TERM GOAL #2   Title Pt will improve Dynamic Gait Assessment to at least 15/24 for decreased fall risk.   Status Achieved  DGI 15/24   PT LONG TERM GOAL #3   Title Pt will improve TUG cognitive score to less than or equal to 15 seconds for decreased fall risk.   Status Not Met  18.30 sec   PT LONG TERM GOAL #4   Title Pt will verbalize understanding of proper body mechanics during ADLs and functional activities.   Status Achieved   PT LONG TERM GOAL #5   Title Pt will verbalize plans for continued community fitness upon D/C from PT.   Status Achieved               Plan - [id number]    Clinical Impression Statement Pt missed last week due to snow.  Pt has met LTG #1, 2, 4, 5.  LTG #3 not met for TUG.  Pt reports she is comfortable with exercises and prefers to discharge today due to financial concerns.   PT Next Visit Plan D/C today   Consulted and Agree with Plan of Care Patient          G-Codes - [id number]    Functional Assessment Tool Used TUG 18.8 seconds, TUG cognitive 18.3 seconds, Dynamic Gait 15/24; pt reports no falls/feels she is using strategies with turns   Functional Limitation Mobility: Walking and moving around   Mobility: Walking and Moving Around Goal Status ([phone] At least 20 percent but less than 40 percent impaired, limited or restricted   Mobility: Walking and Moving Around Discharge Status ([phone] At least 20 percent but less than 40 percent impaired, limited or  restricted      Problem List Patient Active Problem List   Diagnosis Date Noted  . Parkinson's disease 03/02/2014  . Dyspnea on exertion 12/16/2013  . Diabetes 12/16/2013  .  Hyperlipidemia 12/16/2013  . Varicose veins of lower extremities with other complications 20/25/4270  . Swelling of limb 02/05/2012   PHYSICAL THERAPY DISCHARGE SUMMARY  Visits from Start of Care: 6  Current functional level related to goals / functional outcomes: See goals addressed above   Remaining deficits: Posture, balance, gait.  Pt feels balance and gait are improving with strategies learned from therapy.   Education / Equipment: Pt educated in HEP, fall prevention, body mechanics and posture with ADLs, and ongoing community fitness ideas.  Plan: Patient agrees to discharge.  Patient goals were partially met. Patient is being discharged due to not returning since the last visit.  ?????      Ladasia Sircy W. 12/07/2014, 1:05 PM  Mady Haagensen, PT 12/07/2014 1:14 PM Phone: [phone] Fax: [facility] [address] [place], [place], [address] Phone: [phone]   Fax:  [phone]

## 2014-12-07 NOTE — Patient Instructions (Signed)
Use cane or walker.   Continue current medications.

## 2014-12-07 NOTE — Progress Notes (Signed)
PATIENT: Sarah Callahan DOB: Jul 12, 1931   REASON FOR VISIT: follow up for PD HISTORY FROM: patient  Chief Complaint  Patient presents with  . Follow-up    parkinsons      HISTORY OF PRESENT ILLNESS:  UPDATE 12/07/14 (VRP): Since last visit, she feel stable from PD standpoint. No wearing off. Some nightmares and restless legs at night. Still with stress related to family issues. Now only husband and great-grand-daughter are at home.   UPDATE 09/04/14 (VRP): Since last visit, feels better. Taking carb/levo (1.5/1.5/1/1) and doing better. Still with tremor, slowness, stiffness. Still with balance diff. No falls. Uses a walker at times. Her husband had a "light stroke" in July 2015, with good recovery. He does most of driving. Patient still drives a little (daytime). Patient's grand-daughter (and her boyfriend) and great-grand-daughter live with them. Patient has daughter and another granddaughter who could help her in the future if her health declines.   UPDATE 03/02/14 (VRP): Since last visit, she notes that her PD symptoms are more significant in AM (with dose 1 and 2) but sig improve with doses 3 and 4. Now having intermittent dystonic posturing of left foot (previously only in right foot). Some restless legs at night. No falls. Uses a scooter or shopping cart for longer distances. Able to get around at home holding on to walls etc.   UPDATE 10/10/13 (LL):  Since last visit, she started increasing Sinemet from TID to QID, with good results.  Sometimes she is so busy she forgets to take extra dose.  Dystonia is the same in right foot, but she would rather hold off on starting another medication until she has to.  She has custody of her great-granddaughter who is 7 and has ADHD.  UPDATE 07/10/13 (VP): Since last visit, patient is noted some wearing off between dosages. She also is noted dystonia in the right foot. This has been happening over the past 2-3 months. She also has had increasing falls  and balance problems. Patient is alone for this visit. She is not using a walker or cane.   UPDATE 05/29/12: Doing well. Taking carb/levo TID. No wearing off. No dyskinesias. She is ahppy with current dosing. PT has helped. Uses a cane for distance walking.   UPDATE 12/22/11: Doing better on carb/levo. notices tremor is worse if she misses a dose. Still with balance dif, and has fallen down. Also found that ther gas fireplace was leaking carbon monoide into the home. Now replaced, and her fatigue is better.   PRIOR HPI: 81 year old right-handed female with history of diabetes, hypothyroidism, osteoporosis, here for evaluation of tremor. patient reports progressive right upper extremity tremor for the past 1-1/2 years. Tremor is mainly present at rest but sometimes when she holds a cup or utensils. Tremor worsens with stress. Patient also reports unsteady gait and balance, intermittent drooling, and constipation. No significant change in sense of taste or smell. No reports of sleep disturbance or hallucination. No family history of Parkinson's disease.   REVIEW OF SYSTEMS: Full 14 system review of systems performed and notable only for light sens double vision sleep talking walking diff blood in urine depression.   ALLERGIES: Allergies  Allergen Reactions  . Cephalexin Other (See Comments)    Gets UTI after taking  . Codeine Itching  . E-Mycin [Erythromycin Base] Nausea And Vomiting  . Elavil [Amitriptyline] Other (See Comments)  . Lidocaine Other (See Comments)    Feels hot, turns red  . Neurontin [Gabapentin] Other (See  Comments)    Depression  . Penicillins Hives  . Pravastatin Other (See Comments)    Bone and joint pain  . Sulfa Drugs Cross Reactors Hives  . Tramadol Other (See Comments)    Muscle pain  . Darvon [Propoxyphene] Rash  . Mercury Rash    HOME MEDICATIONS: Outpatient Prescriptions Prior to Visit  Medication Sig Dispense Refill  . ACCU-CHEK AVIVA PLUS test strip     .  Blood Glucose Calibration (ACCU-CHEK AVIVA) SOLN     . carbidopa-levodopa (SINEMET IR) 25-100 MG per tablet Take four doses daily as follows: 1.5 tabs / 1.5 tabs / 1 tab / 1 tab (total 5 tabs per day) 150 tablet 3  . cholecalciferol (VITAMIN D) 1000 UNITS tablet Take 6,000 Units by mouth daily.     . Coenzyme Q10 (COQ-10 PO) Take 1 tablet by mouth 2 (two) times daily.    . Cyanocobalamin (VITAMIN B-12 PO) Take 1 tablet by mouth daily.    . Flaxseed, Linseed, (FLAX SEED OIL PO) Take 1 tablet by mouth daily.    . fluticasone (FLONASE) 50 MCG/ACT nasal spray     . furosemide (LASIX) 20 MG tablet Take 1 tablet by mouth daily.    Marland Kitchen levothyroxine (SYNTHROID, LEVOTHROID) 75 MCG tablet Take 75 mcg by mouth daily.    . Melatonin 3 MG TABS Take 3 mg by mouth daily.    Marland Kitchen omeprazole (PRILOSEC) 40 MG capsule Take 40 mg by mouth daily.    Marland Kitchen PARoxetine (PAXIL) 20 MG tablet Take 20 mg by mouth daily. One tablet daily    . Thiamine HCl (VITAMIN B-1 PO) Take 1 tablet by mouth daily.    . vitamin E (VITAMIN E) 200 UNIT capsule Take 200 Units by mouth daily.    Marland Kitchen oxyCODONE-acetaminophen (PERCOCET/ROXICET) 5-325 MG per tablet Take 1 tablet by mouth as needed.     No facility-administered medications prior to visit.    PAST MEDICAL HISTORY: Past Medical History  Diagnosis Date  . Parkinson disease   . Diabetes mellitus     diet controlled  . GERD (gastroesophageal reflux disease)   . Hypothyroidism   . PONV (postoperative nausea and vomiting)   . Wears glasses   . Arthritis   . Osteoporosis   . OA (osteoarthritis of spine)     , Back  . Hypercalcemia   . Peripheral edema   . Dyspepsia   . Hypercholesteremia   . Macular degeneration   . Vitamin D deficiency   . H/O: rheumatic fever   . Osteoporosis   . Thoracic compression fracture   . Pancreatitis     PAST SURGICAL HISTORY: Past Surgical History  Procedure Laterality Date  . Abdominal hysterectomy    . Hemorrhoid surgery    . Hand surgery     . Foot surgery  2009    hammer toes  . Tonsillectomy    . Appendectomy    . Eye surgery      both cataracts  . Colonoscopy    . Carpal tunnel release      both  . Trigger finger release Right 04/30/2013    Procedure: RELEASE TRIGGER FINGER/A-1 PULLEY RIGHT RING FINGER;  Surgeon: Wynonia Sours, MD;  Location: [facility];  Service: Orthopedics;  Laterality: Right;  . Cataract extraction      FAMILY HISTORY: Family History  Problem Relation Age of Onset  . Heart disease Mother   . Heart disease Father   . Heart disease  Brother   . Heart disease Maternal Grandmother   . Heart disease Maternal Grandfather   . Stroke Maternal Grandfather   . Heart disease Paternal Grandmother   . Heart disease Paternal Grandfather   . Heart attack Paternal Grandfather     SOCIAL HISTORY: History   Social History  . Marital Status: Married    Spouse Name: N/A    Number of Children: 3  . Years of Education: College   Occupational History  .      n/a   Social History Main Topics  . Smoking status: Never Smoker   . Smokeless tobacco: Never Used  . Alcohol Use: No  . Drug Use: No  . Sexual Activity: Not on file   Other Topics Concern  . Not on file   Social History Narrative   Patient lives at home with her spouse and great-granddaughter.    3 children, 1 deceased    Caffeine Use: 2-3 cups of coffee in the a.m. daily     PHYSICAL EXAM  Filed Vitals:   12/07/14 1307  BP: 155/79  Pulse: 74  Temp: 98.1 F (36.7 C)  TempSrc: Oral  Height: 5\' 6"  (1.676 m)  Weight: 130 lb 6.4 oz (59.149 kg)   Body mass index is 21.06 kg/(m^2).  EXAM:  General: Patient is awake, alert and in no acute distress. Well developed and groomed. POSITIVE MYERSON'S. MASKED FACIES.  Neck: Neck is supple.  Cardiovascular: No carotid artery bruits. Heart is regular rate and rhythm with no murmurs.   Neurologic Exam  Mental Status: Awake, alert. Language is fluent and comprehension  intact.  Cranial Nerves: Pupils are equal and reactive to light. Visual fields are full to confrontation. Conjugate eye movements are full and symmetric. Facial sensation and strength are symmetric. Hearing is intact. Palate elevated symmetrically and uvula is midline. Shoulder shrug is symmetric. Tongue is midline. SOFT, HOARSE VOICE.  Motor: RESTING TREMOR OF RUE. COGWHEELING IN RUE > LUE. BRADYKINESIA IN BUE AND BLE. Full strength in the upper and lower extremities. No pronator drift.  Sensory: Intact and symmetric to light touch  Coordination: No ataxia or dysmetria on finger-nose or rapid alternating movement testing.  Gait and Station: STOOPED POSTURE. SIGNIFICANT KYPHOSIS-SCOLIOSIS. POOR ARM SWING. SLOW, SHORT STEPS. Romberg is negative. Reflexes: Deep tendon reflexes in the upper and lower extremity are present and symmetric. ABSENT AT ANKLES.  DIAGNOSTIC DATA (LABS, IMAGING, TESTING) - I reviewed patient records, labs, notes, testing and imaging myself where available.  Lab Results  Component Value Date   WBC 5.6 12/16/2013   HGB 12.6 12/16/2013   HCT 38.9 12/16/2013   MCV 93.3 12/16/2013   PLT 224.0 12/16/2013      Component Value Date/Time   NA 141 04/30/2013 0820   K 4.0 04/30/2013 0820   CL 99 04/30/2013 0820   CO2 30 09/07/2008 1600   GLUCOSE 122* 04/30/2013 0820   BUN 18 04/30/2013 0820   CREATININE 0.70 04/30/2013 0820   CALCIUM 9.8 09/07/2008 1600   GFRNONAA >60 09/07/2008 1600   GFRAA  Value: >60   09/07/2008 1600    10/26/11 MRI brain - mild bitemporal atrophy    ASSESSMENT AND PLAN 81 y.o. female with diabetes, hypothyroidism, osteoporosis, with progressive right upper extremity resting tremor. She has cogwheel rigidity, bradykinesia, stooped posture, and Myerson's sign on exam.   Dx: idiopathic Parkinson's disease   PLAN:  1. Continue carbidopa/levodopa 1.5 / 1.5 / 1 / 1 (total 5 tabs per day); may  increase to 1.5 tabs QID later if needed (6AM, 10AM, 2PM, 6PM);    2. next step may include adding azilect vs. entacapone vs. neupro patch 3. Use cane/walker for fall prevention  Return in about 6 months (around 06/07/2015).    Penni Bombard, MD 02/05/1030, 2:81 PM Certified in Neurology, Neurophysiology and Neuroimaging  [facility] [address] [place], [place] [address] [phone]

## 2014-12-16 ENCOUNTER — Ambulatory Visit: Payer: Medicare Other | Admitting: Physical Therapy

## 2015-01-21 ENCOUNTER — Other Ambulatory Visit: Payer: Self-pay

## 2015-01-21 MED ORDER — CARBIDOPA-LEVODOPA 25-100 MG PO TABS: ORAL_TABLET | ORAL | Status: DC

## 2015-03-01 ENCOUNTER — Other Ambulatory Visit: Payer: Self-pay

## 2015-03-01 DIAGNOSIS — Z1231 Encounter for screening mammogram for malignant neoplasm of breast: Secondary | ICD-10-CM

## 2015-03-10 ENCOUNTER — Ambulatory Visit
Admission: RE | Admit: 2015-03-10 | Discharge: 2015-03-10 | Disposition: A | Discharge status: Home / Self Care | Payer: Medicare Other | Source: Ambulatory Visit

## 2015-03-10 DIAGNOSIS — Z1231 Encounter for screening mammogram for malignant neoplasm of breast: Secondary | ICD-10-CM

## 2015-04-23 ENCOUNTER — Other Ambulatory Visit: Payer: Self-pay | Admitting: Physician Assistant

## 2015-05-03 ENCOUNTER — Other Ambulatory Visit: Payer: Self-pay

## 2015-05-26 ENCOUNTER — Telehealth: Payer: Self-pay | Admitting: Diagnostic Neuroimaging

## 2015-05-26 MED ORDER — CARBIDOPA-LEVODOPA 25-100 MG PO TABS: ORAL_TABLET | ORAL | Status: DC

## 2015-05-26 NOTE — Telephone Encounter (Signed)
Patient is needing a refill of Carbidopa-Levodopa 25-100 MG. The best number to contact is [phone]

## 2015-05-26 NOTE — Telephone Encounter (Signed)
Rx has been sent.  I called back to advise.  Got no answer.  Left message.   

## 2015-05-26 NOTE — Telephone Encounter (Signed)
Patient is needing a refill on Carbidopa-Levodopa 25-100 MG the best number to contact is [phone]

## 2015-08-02 ENCOUNTER — Other Ambulatory Visit: Payer: Self-pay | Admitting: Physician Assistant

## 2015-09-07 HISTORY — PX: MOHS SURGERY: SUR867

## 2015-09-21 ENCOUNTER — Other Ambulatory Visit: Payer: Self-pay | Admitting: Diagnostic Neuroimaging

## 2015-10-19 ENCOUNTER — Telehealth: Payer: Self-pay | Admitting: Diagnostic Neuroimaging

## 2015-10-19 NOTE — Telephone Encounter (Signed)
Patient is calling in regard to Rx Carbbidopa-levodopa 25-100 mg tablets.  She takes 5 tablets per day. Her legs and right arm is shaky, she has fallen backwards, and she has no energy.  She feels she needs an increased dosage or another medication.  Please call patient as to how to move forward of to see if she needs to be worked in as there are no appointments available in the near future.  Thanks!

## 2015-10-20 NOTE — Telephone Encounter (Signed)
Vik,  I didn't see this till this morning. If you're unable to contact them, let me know and I'd be happy to.

## 2015-10-20 NOTE — Telephone Encounter (Signed)
Rn talk to Dr. Leta Baptist about patient wanting to increase her Carbidopa  Dr Leta Baptist told Rn per his last office note pt can increase her Carbidopa to 1.5 qid at 0600,1000, 0200pm, and 0600pm. Rn will call patient on 10-21-15 about increasing dosage and scheduling appt within the next two weeks.

## 2015-10-21 NOTE — Telephone Encounter (Signed)
Rn call patient about her right arm shaky and falling backwards at time. Pt would like her carbidopa increase or med change. Rn explain that in Dr. Leta Baptist last office note 12/2014 pt can increase her carbidopa to 1.5 four times a day with times of 0600, 1000, 0200pm, and 0600pm. Pt stated she will increase to 1.5. Rn also stated Dr. Leta Baptist would like to see her for a follow up in tne next month. Appointment was schedule for February 2016, pt told to arrive 20 min early for check in. Pt verbalized understanding of coming to appt and increasing her carbidopa to 1.5.

## 2015-10-22 ENCOUNTER — Telehealth: Payer: Self-pay | Admitting: Diagnostic Neuroimaging

## 2015-10-22 MED ORDER — CARBIDOPA-LEVODOPA 25-100 MG PO TABS: 1.5000 | ORAL_TABLET | Freq: Four times a day (QID) | ORAL | Status: DC

## 2015-10-22 NOTE — Telephone Encounter (Signed)
Rx has been sent.  Receipt confirmed by pharmacy.   

## 2015-10-22 NOTE — Telephone Encounter (Signed)
Patient is calling to get a new Rx called in for carbidopa-levodopa (SINEMET IR) 25-100 MG tablet since the dosage was changed. Please call to [facility] on [facility]. Thank you.

## 2015-11-17 ENCOUNTER — Encounter: Payer: Self-pay | Admitting: Diagnostic Neuroimaging

## 2015-11-17 ENCOUNTER — Ambulatory Visit (INDEPENDENT_AMBULATORY_CARE_PROVIDER_SITE_OTHER): Payer: Medicare Other | Admitting: Diagnostic Neuroimaging

## 2015-11-17 VITALS — BP 121/74 | HR 75 | Ht 64.0 in | Wt 124.5 lb

## 2015-11-17 DIAGNOSIS — G20 Parkinson's disease: Secondary | ICD-10-CM | POA: Diagnosis not present

## 2015-11-17 MED ORDER — CARBIDOPA-LEVODOPA 25-100 MG PO TABS: 1.5000 | ORAL_TABLET | Freq: Four times a day (QID) | ORAL | Status: DC

## 2015-11-17 NOTE — Patient Instructions (Signed)
Thank you for coming to see [place] at [facility]. I hope we have been able to provide you high quality care today.  You may receive a patient satisfaction survey over the next few weeks. We would appreciate your feedback and comments so that we may continue to improve ourselves and the health of our patients.  - continue current medications - use walker to reduce risk for falling   ~~~~~~~~~~~~~~~~~~~~~~~~~~~~~~~~~~~~~~~~~~~~~~~~~~~~~~~~~~~~~~~~~  DR. PENUMALLI'S GUIDE TO HAPPY AND HEALTHY LIVING These are some of my general health and wellness recommendations. Some of them may apply to you better than others. Please use common sense as you try these suggestions and feel free to ask me any questions.   ACTIVITY/FITNESS Mental, social, emotional and physical stimulation are very important for brain and body health. Try learning a new activity (arts, music, language, sports, games).  Keep moving your body to the best of your abilities. You can do this at home, inside or outside, the park, community center, gym or anywhere you like. Consider a physical therapist or personal trainer to get started. Consider the app Sworkit. Fitness trackers such as smart-watches, smart-phones or Fitbits can help as well.   NUTRITION Eat more plants: colorful vegetables, nuts, seeds and berries.  Eat less sugar, salt, preservatives and processed foods.  Avoid toxins such as cigarettes and alcohol.  Drink water when you are thirsty. Warm water with a slice of lemon is an excellent morning drink to start the day.  Consider these websites for more information The Nutrition Source (https://www.henry-hernandez.biz/) Precision Nutrition (WindowBlog.ch)   RELAXATION Consider practicing mindfulness meditation or other relaxation techniques such as deep breathing, prayer, yoga, tai chi, massage. See website mindful.org or the apps Headspace or Calm to  help get started.   SLEEP Try to get at least 7-8+ hours sleep per day. Regular exercise and reduced caffeine will help you sleep better. Practice good sleep hygeine techniques. See website sleep.org for more information.   PLANNING Prepare estate planning, living will, healthcare POA documents. Sometimes this is best planned with the help of an attorney. Theconversationproject.org and agingwithdignity.org are excellent resources.
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
## 2016-03-02 ENCOUNTER — Emergency Department (HOSPITAL_COMMUNITY)
Admission: EM | Admit: 2016-03-02 | Discharge: 2016-03-02 | Disposition: A | Discharge status: Home / Self Care | Payer: Medicare Other | Attending: Emergency Medicine | Admitting: Emergency Medicine

## 2016-03-02 ENCOUNTER — Emergency Department (HOSPITAL_COMMUNITY): Payer: Medicare Other

## 2016-03-02 ENCOUNTER — Encounter (HOSPITAL_COMMUNITY): Payer: Self-pay | Admitting: *Deleted

## 2016-03-02 DIAGNOSIS — Z85828 Personal history of other malignant neoplasm of skin: Secondary | ICD-10-CM | POA: Diagnosis not present

## 2016-03-02 DIAGNOSIS — Y998 Other external cause status: Secondary | ICD-10-CM | POA: Insufficient documentation

## 2016-03-02 DIAGNOSIS — Z7951 Long term (current) use of inhaled steroids: Secondary | ICD-10-CM | POA: Insufficient documentation

## 2016-03-02 DIAGNOSIS — Z79899 Other long term (current) drug therapy: Secondary | ICD-10-CM | POA: Diagnosis not present

## 2016-03-02 DIAGNOSIS — Y92410 Unspecified street and highway as the place of occurrence of the external cause: Secondary | ICD-10-CM | POA: Diagnosis not present

## 2016-03-02 DIAGNOSIS — S20219A Contusion of unspecified front wall of thorax, initial encounter: Secondary | ICD-10-CM | POA: Diagnosis not present

## 2016-03-02 DIAGNOSIS — G20 Parkinson's disease: Secondary | ICD-10-CM | POA: Insufficient documentation

## 2016-03-02 DIAGNOSIS — Z880 Allergy status to penicillin: Secondary | ICD-10-CM | POA: Diagnosis not present

## 2016-03-02 DIAGNOSIS — E559 Vitamin D deficiency, unspecified: Secondary | ICD-10-CM | POA: Insufficient documentation

## 2016-03-02 DIAGNOSIS — Y9389 Activity, other specified: Secondary | ICD-10-CM | POA: Insufficient documentation

## 2016-03-02 DIAGNOSIS — E039 Hypothyroidism, unspecified: Secondary | ICD-10-CM | POA: Diagnosis not present

## 2016-03-02 DIAGNOSIS — S29001A Unspecified injury of muscle and tendon of front wall of thorax, initial encounter: Secondary | ICD-10-CM | POA: Diagnosis present

## 2016-03-02 DIAGNOSIS — E119 Type 2 diabetes mellitus without complications: Secondary | ICD-10-CM | POA: Insufficient documentation

## 2016-03-02 MED ORDER — ACETAMINOPHEN 325 MG PO TABS: 650.0000 mg | ORAL_TABLET | Freq: Once | ORAL | Status: DC

## 2016-03-02 NOTE — ED Notes (Signed)
MD at bedside. 
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
## 2016-05-16 ENCOUNTER — Encounter: Payer: Self-pay | Admitting: Diagnostic Neuroimaging

## 2016-05-16 ENCOUNTER — Ambulatory Visit (INDEPENDENT_AMBULATORY_CARE_PROVIDER_SITE_OTHER): Payer: Medicare Other | Admitting: Diagnostic Neuroimaging

## 2016-05-16 VITALS — BP 118/69 | HR 81 | Ht 64.0 in | Wt 125.6 lb

## 2016-05-16 DIAGNOSIS — G20 Parkinson's disease: Secondary | ICD-10-CM | POA: Diagnosis not present

## 2016-05-16 MED ORDER — CARBIDOPA-LEVODOPA 25-100 MG PO TABS: 1.0000 | ORAL_TABLET | Freq: Four times a day (QID) | ORAL | Status: DC

## 2016-05-16 NOTE — Patient Instructions (Signed)
  Increase carbidopa-levodopa dosing  7:30am - 2 tabs 11:30am - 2 tabs 3:30pm - 1.5 or 2 tabs 7:30pm - 1.5 or 2 tabs

## 2016-05-16 NOTE — Progress Notes (Signed)
PATIENT: Sarah Callahan DOB: 1931-07-24   REASON FOR VISIT: follow up for PD HISTORY FROM: patient  Chief Complaint  Patient presents with  . Parkinson's disease    rm 8 great granddgtr-Natalie, "I can't go 4 hours without feeling shakey and tired. My legs feel like they want to collapse under me. Gradually getting a little worse. Wondering if I need an increase in my medication."  . Follow-up    6 month     HISTORY OF PRESENT ILLNESS:  UPDATE 05/15/16: Since last visit, doing fair; some wearing off after 7:30am dose --> by 10:30-11 feels wearing off. Afternoon is better. Sleep and gait otherwise stable.   UPDATE 11/17/15: Since last visit, doing well on carb/levo 1.5 tabs QID. Overall doing well. 2-3 wks ago, stood up quickly, then fell backwards and thinks she passed out. Her family was there and caught her from falling to ground. She regained consciousness quickly. No major injuries or post ictal confusion.   UPDATE 12/07/14 (VRP): Since last visit, she feel stable from PD standpoint. No wearing off. Some nightmares and restless legs at night. Still with stress related to family issues. Now only husband and great-grand-daughter are at home.   UPDATE 09/04/14 (VRP): Since last visit, feels better. Taking carb/levo (1.5/1.5/1/1) and doing better. Still with tremor, slowness, stiffness. Still with balance diff. No falls. Uses a walker at times. Her husband had a "light stroke" in July 2015, with good recovery. He does most of driving. Patient still drives a little (daytime). Patient's grand-daughter (and her boyfriend) and great-grand-daughter live with them. Patient has daughter and another granddaughter who could help her in the future if her health declines.   UPDATE 03/02/14 (VRP): Since last visit, she notes that her PD symptoms are more significant in AM (with dose 1 and 2) but sig improve with doses 3 and 4. Now having intermittent dystonic posturing of left foot (previously only in  right foot). Some restless legs at night. No falls. Uses a scooter or shopping cart for longer distances. Able to get around at home holding on to walls etc.   UPDATE 10/10/13 (LL):  Since last visit, she started increasing Sinemet from TID to QID, with good results.  Sometimes she is so busy she forgets to take extra dose.  Dystonia is the same in right foot, but she would rather hold off on starting another medication until she has to.  She has custody of her great-granddaughter who is 7 and has ADHD.  UPDATE 07/10/13 (VP): Since last visit, patient is noted some wearing off between dosages. She also is noted dystonia in the right foot. This has been happening over the past 2-3 months. She also has had increasing falls and balance problems. Patient is alone for this visit. She is not using a walker or cane.   UPDATE 05/29/12: Doing well. Taking carb/levo TID. No wearing off. No dyskinesias. She is ahppy with current dosing. PT has helped. Uses a cane for distance walking.   UPDATE 12/22/11: Doing better on carb/levo. notices tremor is worse if she misses a dose. Still with balance dif, and has fallen down. Also found that ther gas fireplace was leaking carbon monoide into the home. Now replaced, and her fatigue is better.   PRIOR HPI: 82 year old right-handed female with history of diabetes, hypothyroidism, osteoporosis, here for evaluation of tremor. patient reports progressive right upper extremity tremor for the past 1-1/2 years. Tremor is mainly present at rest but sometimes when she holds  a cup or utensils. Tremor worsens with stress. Patient also reports unsteady gait and balance, intermittent drooling, and constipation. No significant change in sense of taste or smell. No reports of sleep disturbance or hallucination. No family history of Parkinson's disease.   REVIEW OF SYSTEMS: Full 14 system review of systems performed and negative except as per HPI.   ALLERGIES: Allergies  Allergen Reactions   . Cephalexin Other (See Comments)    Gets UTI after taking  . Codeine Itching  . E-Mycin [Erythromycin Base] Nausea And Vomiting  . Elavil [Amitriptyline] Other (See Comments)  . Lidocaine Other (See Comments)    Feels hot, turns red  . Neurontin [Gabapentin] Other (See Comments)    Depression  . Penicillins Hives  . Pravastatin Other (See Comments)    Bone and joint pain  . Sulfa Drugs Cross Reactors Hives  . Tramadol Other (See Comments)    Muscle pain  . Darvon [Propoxyphene] Rash  . Mercury Rash    HOME MEDICATIONS: Outpatient Prescriptions Prior to Visit  Medication Sig Dispense Refill  . cholecalciferol (VITAMIN D) 1000 UNITS tablet Take 6,000 Units by mouth daily.     . Coenzyme Q10 (COQ-10 PO) Take 1 tablet by mouth 2 (two) times daily.    . Cyanocobalamin (VITAMIN B-12 PO) Take 1 tablet by mouth daily.    Marland Kitchen ESTRACE VAGINAL 0.1 MG/GM vaginal cream Place 1 Applicatorful vaginally once a week.   0  . Flaxseed, Linseed, (FLAX SEED OIL PO) Take 1 tablet by mouth daily.    . fluticasone (FLONASE) 50 MCG/ACT nasal spray Place 2 sprays into both nostrils daily.     . furosemide (LASIX) 20 MG tablet Take 1 tablet by mouth daily.    Marland Kitchen levothyroxine (SYNTHROID, LEVOTHROID) 75 MCG tablet Take 75 mcg by mouth daily.    . Melatonin 3 MG TABS Take 3 mg by mouth daily.    Marland Kitchen omeprazole (PRILOSEC) 40 MG capsule Take 40 mg by mouth daily.    Marland Kitchen PARoxetine (PAXIL) 20 MG tablet Take 20 mg by mouth daily. One tablet daily    . Thiamine HCl (VITAMIN B-1 PO) Take 1 tablet by mouth daily.    . vitamin E (VITAMIN E) 200 UNIT capsule Take 200 Units by mouth daily.    . carbidopa-levodopa (SINEMET IR) 25-100 MG tablet Take 1.5 tablets by mouth 4 (four) times daily. 540 tablet 4   No facility-administered medications prior to visit.    PAST MEDICAL HISTORY: Past Medical History  Diagnosis Date  . Parkinson disease ([place])   . Diabetes mellitus     diet controlled  . GERD (gastroesophageal  reflux disease)   . Hypothyroidism   . PONV (postoperative nausea and vomiting)   . Wears glasses   . Arthritis   . Osteoporosis   . OA (osteoarthritis of spine)     , Back  . Hypercalcemia   . Peripheral edema   . Dyspepsia   . Hypercholesteremia   . Macular degeneration   . Vitamin D deficiency   . H/O: rheumatic fever   . Osteoporosis   . Thoracic compression fracture ([place])   . Pancreatitis   . Cancer ([place])     forehead, basal cell    PAST SURGICAL HISTORY: Past Surgical History  Procedure Laterality Date  . Abdominal hysterectomy    . Hemorrhoid surgery    . Hand surgery    . Foot surgery  2009    hammer toes  . Tonsillectomy    .  Appendectomy    . Eye surgery      both cataracts  . Colonoscopy    . Carpal tunnel release      both  . Trigger finger release Right 04/30/2013    Procedure: RELEASE TRIGGER FINGER/A-1 PULLEY RIGHT RING FINGER;  Surgeon: Wynonia Sours, MD;  Location: [facility];  Service: Orthopedics;  Laterality: Right;  . Cataract extraction    . Mohs surgery Right 09/2015    forehead, basal cell    FAMILY HISTORY: Family History  Problem Relation Age of Onset  . Heart disease Mother   . Heart disease Father   . Heart disease Brother   . Heart disease Maternal Grandmother   . Heart disease Maternal Grandfather   . Stroke Maternal Grandfather   . Heart disease Paternal Grandmother   . Heart disease Paternal Grandfather   . Heart attack Paternal Grandfather     SOCIAL HISTORY: Social History   Social History  . Marital Status: Married    Spouse Name: N/A  . Number of Children: 3  . Years of Education: College   Occupational History  .      n/a   Social History Main Topics  . Smoking status: Never Smoker   . Smokeless tobacco: Never Used  . Alcohol Use: No  . Drug Use: No  . Sexual Activity: Not on file   Other Topics Concern  . Not on file   Social History Narrative   Patient lives at home with her spouse  and great-granddaughter.    3 children, 1 deceased    Caffeine Use: 2-3 cups of coffee in the a.m. daily     PHYSICAL EXAM  Filed Vitals:   05/16/16 1002  BP: 118/69  Pulse: 81  Height: 5\' 4"  (1.626 m)  Weight: 125 lb 9.6 oz (56.972 kg)   Body mass index is 21.55 kg/(m^2).  EXAM:  General: Patient is awake, alert and in no acute distress. Well developed and groomed. POSITIVE MYERSON'S. MASKED FACIES.  Neck: Neck is supple.  Cardiovascular: No carotid artery bruits. Heart is regular rate and rhythm with no murmurs.   Neurologic Exam  Mental Status: Awake, alert. Language is fluent and comprehension intact.  Cranial Nerves: Pupils are equal and reactive to light. Visual fields are full to confrontation. Conjugate eye movements are full and symmetric. Facial sensation and strength are symmetric. Hearing is intact. Palate elevated symmetrically and uvula is midline. Shoulder shrug is symmetric. Tongue is midline. SOFT, HOARSE VOICE.  Motor: INTERMITTENT RESTING TREMOR OF RUE. COGWHEELING IN RUE > LUE. BRADYKINESIA IN BUE AND BLE. Full strength in the upper and lower extremities. No pronator drift.  Sensory: Intact and symmetric to light touch  Coordination: No ataxia or dysmetria on finger-nose or rapid alternating movement testing.  Gait and Station: STOOPED POSTURE. MODERATE UNSTEADY GAIT. SIGNIFICANT KYPHOSIS-SCOLIOSIS. POOR ARM SWING. SLOW, SHORT STEPS. Romberg is negative. Reflexes: Deep tendon reflexes in the upper and lower extremity are present and symmetric. ABSENT AT ANKLES.  DIAGNOSTIC DATA (LABS, IMAGING, TESTING) - I reviewed patient records, labs, notes, testing and imaging myself where available.  Lab Results  Component Value Date   WBC 5.6 12/16/2013   HGB 12.6 12/16/2013   HCT 38.9 12/16/2013   MCV 93.3 12/16/2013   PLT 224.0 12/16/2013      Component Value Date/Time   NA 141 04/30/2013 0820   K 4.0 04/30/2013 0820   CL 99 04/30/2013 0820   CO2 30 09/07/2008  1600   GLUCOSE 122* 04/30/2013 0820   BUN 18 04/30/2013 0820   CREATININE 0.70 04/30/2013 0820   CALCIUM 9.8 09/07/2008 1600   GFRNONAA >60 09/07/2008 1600   GFRAA  Value: >60   09/07/2008 1600    10/26/11 MRI brain - mild bitemporal atrophy    ASSESSMENT AND PLAN 82 y.o. female with diabetes, hypothyroidism, osteoporosis, with progressive right upper extremity resting tremor. She has cogwheel rigidity, bradykinesia, stooped posture, and Myerson's sign on exam. Continued progression of disease. Had orthostatic hypotension event / syncope in Jan 2017, due to rapidly standing. Now some wearing off.   Dx: idiopathic Parkinson's disease   Parkinson's disease ([place])    PLAN:  - Increase carbidopa-levodopa dosing: 7:30am - 2 tabs 11:30am - 2 tabs 3:30pm - 1.5 or 2 tabs 7:30pm - 1.5 or 2 tabs - in future may include adding azilect vs. entacapone vs. neupro patch - continue cane/walker for fall prevention  Meds ordered this encounter  Medications  . carbidopa-levodopa (SINEMET IR) 25-100 MG tablet    Sig: Take 1-2 tablets by mouth 4 (four) times daily.    Dispense:  720 tablet    Refill:  4   Return in about 6 months (around 11/16/2016).    Penni Bombard, MD [id number], [id number] AM Certified in Neurology, Neurophysiology and Neuroimaging  [facility] [address], [place], [place] [address] [phone]

## 2016-11-21 ENCOUNTER — Ambulatory Visit (INDEPENDENT_AMBULATORY_CARE_PROVIDER_SITE_OTHER): Payer: Medicare Other | Admitting: Diagnostic Neuroimaging

## 2016-11-21 ENCOUNTER — Encounter: Payer: Self-pay | Admitting: Diagnostic Neuroimaging

## 2016-11-21 VITALS — BP 118/71 | HR 86 | Wt 121.0 lb

## 2016-11-21 DIAGNOSIS — H8110 Benign paroxysmal vertigo, unspecified ear: Secondary | ICD-10-CM

## 2016-11-21 DIAGNOSIS — G20 Parkinson's disease: Secondary | ICD-10-CM | POA: Diagnosis not present

## 2016-11-21 DIAGNOSIS — R42 Dizziness and giddiness: Secondary | ICD-10-CM

## 2016-11-21 MED ORDER — CARBIDOPA-LEVODOPA 25-100 MG PO TABS: 2.0000 | ORAL_TABLET | Freq: Four times a day (QID) | ORAL | 4 refills | Status: DC

## 2016-11-21 NOTE — Progress Notes (Signed)
PATIENT: Sarah Callahan DOB: 1931/06/06   REASON FOR VISIT: follow up for PD HISTORY FROM: patient  Chief Complaint  Patient presents with  . Parkinson's disease    rm 9, " in last few wks I've noticed I'm more tired in morning, does another med adjustment need to be made?; one episode of  'hitting a brick wall'- vertigo off and on; began this morning again"  . Follow-up    6 month     HISTORY OF PRESENT ILLNESS:  UPDATE 11/21/16: Since last visit, pt is stable with PD. She has had some falls, but no major injuries. Using a cane and her scooter. Sometimes uses walker. Also unfortunately pt's husband passed away in December 03, 2016 from heart attack (age 89 yrs old). Pt living at home with grand-daughter and son-in-law. More fatigue in early AM. Some recent positional vertigo when getting up in the AM. No nausea or vomiting.   UPDATE 05/15/16: Since last visit, doing fair; some wearing off after 7:30am dose --> by 10:30-11 feels wearing off. Afternoon is better. Sleep and gait otherwise stable.   UPDATE 11/17/15: Since last visit, doing well on carb/levo 1.5 tabs QID. Overall doing well. 2-3 wks ago, stood up quickly, then fell backwards and thinks she passed out. Her family was there and caught her from falling to ground. She regained consciousness quickly. No major injuries or post ictal confusion.   UPDATE 12/07/14 (VRP): Since last visit, she feel stable from PD standpoint. No wearing off. Some nightmares and restless legs at night. Still with stress related to family issues. Now only husband and great-grand-daughter are at home.   UPDATE 09/04/14 (VRP): Since last visit, feels better. Taking carb/levo (1.5/1.5/1/1) and doing better. Still with tremor, slowness, stiffness. Still with balance diff. No falls. Uses a walker at times. Her husband had a "light stroke" in July 2015, with good recovery. He does most of driving. Patient still drives a little (daytime). Patient's grand-daughter (and her  boyfriend) and great-grand-daughter live with them. Patient has daughter and another granddaughter who could help her in the future if her health declines.   UPDATE 03/02/14 (VRP): Since last visit, she notes that her PD symptoms are more significant in AM (with dose 1 and 2) but sig improve with doses 3 and 4. Now having intermittent dystonic posturing of left foot (previously only in right foot). Some restless legs at night. No falls. Uses a scooter or shopping cart for longer distances. Able to get around at home holding on to walls etc.   UPDATE 10/10/13 (LL):  Since last visit, she started increasing Sinemet from TID to QID, with good results.  Sometimes she is so busy she forgets to take extra dose.  Dystonia is the same in right foot, but she would rather hold off on starting another medication until she has to.  She has custody of her great-granddaughter who is 7 and has ADHD.  UPDATE 07/10/13 (VP): Since last visit, patient is noted some wearing off between dosages. She also is noted dystonia in the right foot. This has been happening over the past 2-3 months. She also has had increasing falls and balance problems. Patient is alone for this visit. She is not using a walker or cane.   UPDATE 05/29/12: Doing well. Taking carb/levo TID. No wearing off. No dyskinesias. She is ahppy with current dosing. PT has helped. Uses a cane for distance walking.   UPDATE 12/22/11: Doing better on carb/levo. notices tremor is worse if  she misses a dose. Still with balance dif, and has fallen down. Also found that ther gas fireplace was leaking carbon monoide into the home. Now replaced, and her fatigue is better.   PRIOR HPI: 83 year old right-handed female with history of diabetes, hypothyroidism, osteoporosis, here for evaluation of tremor. patient reports progressive right upper extremity tremor for the past 1-1/2 years. Tremor is mainly present at rest but sometimes when she holds a cup or utensils. Tremor worsens  with stress. Patient also reports unsteady gait and balance, intermittent drooling, and constipation. No significant change in sense of taste or smell. No reports of sleep disturbance or hallucination. No family history of Parkinson's disease.    REVIEW OF SYSTEMS: Full 14 system review of systems performed and negative except: light sens double vision joint pain numbness incontinence.    ALLERGIES: Allergies  Allergen Reactions  . Cephalexin Other (See Comments)    Gets UTI after taking  . Codeine Itching  . E-Mycin [Erythromycin Base] Nausea And Vomiting  . Elavil [Amitriptyline] Other (See Comments)  . Lidocaine Other (See Comments)    Feels hot, turns red  . Neurontin [Gabapentin] Other (See Comments)    Depression  . Penicillins Hives  . Pravastatin Other (See Comments)    Bone and joint pain  . Sulfa Drugs Cross Reactors Hives  . Tramadol Other (See Comments)    Muscle pain  . Darvon [Propoxyphene] Rash  . Mercury Rash    HOME MEDICATIONS: Outpatient Medications Prior to Visit  Medication Sig Dispense Refill  . carbidopa-levodopa (SINEMET IR) 25-100 MG tablet Take 1-2 tablets by mouth 4 (four) times daily. 720 tablet 4  . cholecalciferol (VITAMIN D) 1000 UNITS tablet Take 6,000 Units by mouth daily.     . Coenzyme Q10 (COQ-10 PO) Take 1 tablet by mouth 2 (two) times daily.    . Cyanocobalamin (VITAMIN B-12 PO) Take 1 tablet by mouth daily.    Marland Kitchen ESTRACE VAGINAL 0.1 MG/GM vaginal cream Place 1 Applicatorful vaginally once a week.   0  . Flaxseed, Linseed, (FLAX SEED OIL PO) Take 1 tablet by mouth daily.    . fluticasone (FLONASE) 50 MCG/ACT nasal spray Place 2 sprays into both nostrils daily.     . furosemide (LASIX) 20 MG tablet Take 1 tablet by mouth daily.    Marland Kitchen levothyroxine (SYNTHROID, LEVOTHROID) 75 MCG tablet Take 75 mcg by mouth daily.    . Melatonin 3 MG TABS Take 3 mg by mouth daily.    Marland Kitchen omeprazole (PRILOSEC) 40 MG capsule Take 40 mg by mouth daily.    Marland Kitchen  PARoxetine (PAXIL) 20 MG tablet Take 20 mg by mouth daily. One tablet daily    . Thiamine HCl (VITAMIN B-1 PO) Take 1 tablet by mouth daily.    . vitamin E (VITAMIN E) 200 UNIT capsule Take 200 Units by mouth daily.     No facility-administered medications prior to visit.     PAST MEDICAL HISTORY: Past Medical History:  Diagnosis Date  . Arthritis   . Cancer ([facility])    forehead, basal cell  . Diabetes mellitus    diet controlled  . Dyspepsia   . GERD (gastroesophageal reflux disease)   . H/O: rheumatic fever   . Hypercalcemia   . Hypercholesteremia   . Hypothyroidism   . Macular degeneration   . OA (osteoarthritis of spine)    , Back  . Osteoporosis   . Osteoporosis   . Pancreatitis   . Parkinson disease ([place])   .  Peripheral edema   . PONV (postoperative nausea and vomiting)   . Thoracic compression fracture ([place])   . Vitamin D deficiency   . Wears glasses     PAST SURGICAL HISTORY: Past Surgical History:  Procedure Laterality Date  . ABDOMINAL HYSTERECTOMY    . APPENDECTOMY    . CARPAL TUNNEL RELEASE     both  . CATARACT EXTRACTION    . COLONOSCOPY    . EYE SURGERY     both cataracts  . FOOT SURGERY  2009   hammer toes  . HAND SURGERY    . HEMORRHOID SURGERY    . MOHS SURGERY Right 09/2015   forehead, basal cell  . TONSILLECTOMY    . TRIGGER FINGER RELEASE Right 04/30/2013   Procedure: RELEASE TRIGGER FINGER/A-1 PULLEY RIGHT RING FINGER;  Surgeon: Wynonia Sours, MD;  Location: [facility];  Service: Orthopedics;  Laterality: Right;    FAMILY HISTORY: Family History  Problem Relation Age of Onset  . Heart disease Mother   . Heart disease Father   . Heart disease Brother   . Heart disease Maternal Grandmother   . Heart disease Maternal Grandfather   . Stroke Maternal Grandfather   . Heart disease Paternal Grandmother   . Heart disease Paternal Grandfather   . Heart attack Paternal Grandfather     SOCIAL HISTORY: Social History    Social History  . Marital status: Married    Spouse name: N/A  . Number of children: 3  . Years of education: College   Occupational History  .      n/a   Social History Main Topics  . Smoking status: Never Smoker  . Smokeless tobacco: Never Used  . Alcohol use No  . Drug use: No  . Sexual activity: Not on file   Other Topics Concern  . Not on file   Social History Narrative   Patient lives at home with her spouse and great-granddaughter.    3 children, 1 deceased    Caffeine Use: 2-3 cups of coffee in the a.m. daily     PHYSICAL EXAM  Vitals:   11/21/16 0924  BP: 118/71  Pulse: 86  Weight: 121 lb (54.9 kg)   Body mass index is 20.77 kg/m.  EXAM:  General: Patient is awake, alert and in no acute distress. Well developed and groomed. POSITIVE MYERSON'S. MASKED FACIES.  Neck: Neck is supple.  Cardiovascular: No carotid artery bruits. Heart is regular rate and rhythm with no murmurs.   Neurologic Exam  Mental Status: Awake, alert. Language is fluent and comprehension intact.  Cranial Nerves: Pupils are equal and reactive to light. Visual fields are full to confrontation. Conjugate eye movements are full and symmetric. Facial sensation and strength are symmetric. Hearing is intact. Palate elevated symmetrically and uvula is midline. Shoulder shrug is symmetric. Tongue is midline. SOFT, HOARSE VOICE.  Motor: INTERMITTENT RESTING TREMOR OF RUE. COGWHEELING IN RUE > LUE. BRADYKINESIA IN BUE AND BLE. Full strength in the upper and lower extremities. No pronator drift.  Sensory: Intact and symmetric to light touch  Coordination: No ataxia or dysmetria on finger-nose or rapid alternating movement testing.  Gait and Station: STOOPED POSTURE. MODERATE UNSTEADY GAIT. SIGNIFICANT KYPHOSIS-SCOLIOSIS. POOR ARM SWING. SLOW, SHORT STEPS. Romberg is negative. Reflexes: Deep tendon reflexes in the upper and lower extremity are present and symmetric. ABSENT AT ANKLES.  DIAGNOSTIC  DATA (LABS, IMAGING, TESTING) - I reviewed patient records, labs, notes, testing and imaging myself where available.  Lab Results  Component Value Date   WBC 5.6 12/16/2013   HGB 12.6 12/16/2013   HCT 38.9 12/16/2013   MCV 93.3 12/16/2013   PLT 224.0 12/16/2013      Component Value Date/Time   NA 141 04/30/2013 0820   K 4.0 04/30/2013 0820   CL 99 04/30/2013 0820   CO2 30 09/07/2008 1600   GLUCOSE 122* 04/30/2013 0820   BUN 18 04/30/2013 0820   CREATININE 0.70 04/30/2013 0820   CALCIUM 9.8 09/07/2008 1600   GFRNONAA >60 09/07/2008 1600   GFRAA  Value: >60   09/07/2008 1600    10/26/11 MRI brain - mild bitemporal atrophy    ASSESSMENT AND PLAN 83 y.o. female with diabetes, hypothyroidism, osteoporosis, with progressive right upper extremity resting tremor. She has cogwheel rigidity, bradykinesia, stooped posture, and Myerson's sign on exam. Continued progression of disease. Had orthostatic hypotension event / syncope in Jan 2017, due to rapidly standing. Has some wearing off, especially in early AM.   Dx: idiopathic Parkinson's disease   Parkinson's disease ([place])  Postural vertigo, unspecified laterality  Dizziness and giddiness  Lightheaded   PLAN:  - Increase carbidopa-levodopa dosing: 7:30am - 2 tabs 11:30am - 2 tabs 3:30pm - 2 tabs 7:30pm - 2 tabs - in future may include adding azilect vs. entacapone vs. neupro patch - use rollator walker at all times for fall prevention - will setup home PT evaluation for fall prevention  Meds ordered this encounter  Medications  . carbidopa-levodopa (SINEMET IR) 25-100 MG tablet    Sig: Take 2 tablets by mouth 4 (four) times daily.    Dispense:  720 tablet    Refill:  4   Orders Placed This Encounter  Procedures  . Ambulatory referral to [facility]   Return in about 6 months (around 05/21/2017).    Penni Bombard, MD [id number], [id number] AM Certified in Neurology, Neurophysiology and Neuroimaging  [facility] [address] [place], [place] [address] [phone]

## 2016-11-27 ENCOUNTER — Telehealth: Payer: Self-pay | Admitting: *Deleted

## 2016-11-27 NOTE — Telephone Encounter (Signed)
Requesting authorzation for PT , call [id number]

## 2016-11-27 NOTE — Telephone Encounter (Signed)
LVM for [facility] requesting a call back for clarification.

## 2016-11-28 NOTE — Telephone Encounter (Signed)
Received call back from [facility] w/[facility]. She requested verbal orders for PT two times a week x 5 weeks. The PT will treat for strengthening RLE, safe transfer, gait balance training, postural training and home safety. Gave her Dr Gladstone Lighter verbal order to begin above PT for patient. She stated Dr Leta Baptist will receive a fax to sign, verbalized understanding, appreciation.

## 2016-12-07 ENCOUNTER — Telehealth: Payer: Self-pay | Admitting: Diagnostic Neuroimaging

## 2016-12-07 DIAGNOSIS — M2170 Unequal limb length (acquired), unspecified site: Secondary | ICD-10-CM

## 2016-12-07 NOTE — Telephone Encounter (Signed)
Sarah Callahan a physical therapist with [facility] is calling to get a Rx for a built up shoe for leg length discrepancy with left leg shorter than right faxed to [employer] at [phone]. The patient has an appointment in our office on 12-13-16 but this needs to be done before the appointment.

## 2016-12-07 NOTE — Addendum Note (Signed)
Addended by: Florian Buff C on: 12/07/2016 11:26 AM   Modules accepted: Orders

## 2016-12-07 NOTE — Telephone Encounter (Signed)
Rx for requested DME faxed to [employer] [facility].

## 2017-03-17 ENCOUNTER — Encounter (HOSPITAL_COMMUNITY): Payer: Self-pay | Admitting: Emergency Medicine

## 2017-03-17 ENCOUNTER — Emergency Department (HOSPITAL_COMMUNITY): Payer: Medicare Other

## 2017-03-17 ENCOUNTER — Emergency Department (HOSPITAL_COMMUNITY)
Admission: EM | Admit: 2017-03-17 | Discharge: 2017-03-17 | Disposition: A | Discharge status: Home / Self Care | Payer: Medicare Other | Attending: Emergency Medicine | Admitting: Emergency Medicine

## 2017-03-17 DIAGNOSIS — G20 Parkinson's disease: Secondary | ICD-10-CM | POA: Insufficient documentation

## 2017-03-17 DIAGNOSIS — E109 Type 1 diabetes mellitus without complications: Secondary | ICD-10-CM | POA: Diagnosis not present

## 2017-03-17 DIAGNOSIS — Y999 Unspecified external cause status: Secondary | ICD-10-CM | POA: Insufficient documentation

## 2017-03-17 DIAGNOSIS — Y939 Activity, unspecified: Secondary | ICD-10-CM | POA: Insufficient documentation

## 2017-03-17 DIAGNOSIS — S0083XA Contusion of other part of head, initial encounter: Secondary | ICD-10-CM | POA: Diagnosis not present

## 2017-03-17 DIAGNOSIS — E039 Hypothyroidism, unspecified: Secondary | ICD-10-CM | POA: Diagnosis not present

## 2017-03-17 DIAGNOSIS — Z79899 Other long term (current) drug therapy: Secondary | ICD-10-CM | POA: Diagnosis not present

## 2017-03-17 DIAGNOSIS — S0990XA Unspecified injury of head, initial encounter: Secondary | ICD-10-CM | POA: Diagnosis present

## 2017-03-17 DIAGNOSIS — Y92009 Unspecified place in unspecified non-institutional (private) residence as the place of occurrence of the external cause: Secondary | ICD-10-CM | POA: Insufficient documentation

## 2017-03-17 DIAGNOSIS — W1830XA Fall on same level, unspecified, initial encounter: Secondary | ICD-10-CM | POA: Insufficient documentation

## 2017-03-17 DIAGNOSIS — W19XXXA Unspecified fall, initial encounter: Secondary | ICD-10-CM

## 2017-03-17 NOTE — Discharge Instructions (Signed)
Please follow-up with your neurologist in regards to your frequent falls. Return to the emergency department for new or worsening symptoms, any additional concerns.

## 2017-03-17 NOTE — ED Provider Notes (Signed)
[facility] DEPT Provider Note   CSN: [id number] Arrival date & time: 03/17/17  1801  By signing my name below, I, Margit Banda, attest that this documentation has been prepared under the direction and in the presence of [facility].  Electronically Signed: Margit Banda, ED Scribe. 03/17/17. 8:03 PM.  History   Chief Complaint Chief Complaint  Patient presents with  . Head Injury  . Fall    HPI Kamora Vossler is a 83 y.o. female with a PMHx of parkinson's disease who presents to the Emergency Department complaining of a head injury s/p mechanical fall just prior to arrival. Pt notes that if she doesn't take her parkinson's medication at the exact same time, then she can lose her balance. Tonight she was bending over to plug in her sleep machine and hit her left forehead. This was her third fall in the past two weeks. The first fall she fell backwards and hit her head on the cement. The second time she fell forward and caught herself on the coffee table, allowing herself to roll onto the ground. She denies LOC for all three incidents. Pt is not currently on anticoagulant. Pt denies nausea, vomiting, muscle weakness, slurred speech or any other sx at this time. She notes that she routinely gets unsteady when bending forward and this, unfortunately, is something she deals with frequently. Family in the room agree and have been encouraging her to ask for help more often if needed.  The history is provided by the patient. No language interpreter was used.    Past Medical History:  Diagnosis Date  . Arthritis   . Cancer (HCC)    forehead, basal cell  . Diabetes mellitus    diet controlled  . Dyspepsia   . GERD (gastroesophageal reflux disease)   . H/O: rheumatic fever   . Hypercalcemia   . Hypercholesteremia   . Hypothyroidism   . Macular degeneration   . OA (osteoarthritis of spine)    , Back  . Osteoporosis   . Osteoporosis   . Pancreatitis   . Parkinson disease  ([place])   . Peripheral edema   . PONV (postoperative nausea and vomiting)   . Thoracic compression fracture ([place])   . Vitamin D deficiency   . Wears glasses     Patient Active Problem List   Diagnosis Date Noted  . Parkinson's disease ([place]) 03/02/2014  . Dyspnea on exertion 12/16/2013  . Diabetes ([place]) 12/16/2013  . Hyperlipidemia 12/16/2013  . Varicose veins of lower extremities with other complications 40/98/1191  . Swelling of limb 02/05/2012    Past Surgical History:  Procedure Laterality Date  . ABDOMINAL HYSTERECTOMY    . APPENDECTOMY    . CARPAL TUNNEL RELEASE     both  . CATARACT EXTRACTION    . COLONOSCOPY    . EYE SURGERY     both cataracts  . FOOT SURGERY  2009   hammer toes  . HAND SURGERY    . HEMORRHOID SURGERY    . MOHS SURGERY Right 09/2015   forehead, basal cell  . TONSILLECTOMY    . TRIGGER FINGER RELEASE Right 04/30/2013   Procedure: RELEASE TRIGGER FINGER/A-1 PULLEY RIGHT RING FINGER;  Surgeon: Wynonia Sours, MD;  Location: [facility];  Service: Orthopedics;  Laterality: Right;    OB History    No data available       Home Medications    Prior to Admission medications   Medication Sig Start Date End Date  Taking? Authorizing Provider  carbidopa-levodopa (SINEMET IR) 25-100 MG tablet Take 2 tablets by mouth 4 (four) times daily. 11/21/16   Penumalli, Earlean Polka, MD  cholecalciferol (VITAMIN D) 1000 UNITS tablet Take 6,000 Units by mouth daily.     [provider]  Coenzyme Q10 (COQ-10 PO) Take 1 tablet by mouth 2 (two) times daily.    [provider]  Cyanocobalamin (VITAMIN B-12 PO) Take 1 tablet by mouth daily.    [provider]  ESTRACE VAGINAL 0.1 MG/GM vaginal cream Place 1 Applicatorful vaginally once a week.  10/19/14   [provider]  Flaxseed, Linseed, (FLAX SEED OIL PO) Take 1 tablet by mouth daily.    [provider]  fluticasone (FLONASE) 50 MCG/ACT nasal spray Place 2 sprays  into both nostrils daily.  08/14/13   [provider]  furosemide (LASIX) 20 MG tablet Take 1 tablet by mouth daily. 08/23/14   [provider]  levothyroxine (SYNTHROID, LEVOTHROID) 75 MCG tablet Take 75 mcg by mouth daily.    [provider]  Melatonin 3 MG TABS Take 3 mg by mouth daily.    [provider]  omeprazole (PRILOSEC) 40 MG capsule Take 40 mg by mouth daily.    [provider]  PARoxetine (PAXIL) 20 MG tablet Take 20 mg by mouth daily. One tablet daily    [provider]  Thiamine HCl (VITAMIN B-1 PO) Take 1 tablet by mouth daily.    [provider]  vitamin E (VITAMIN E) 200 UNIT capsule Take 200 Units by mouth daily.    [provider]    Family History Family History  Problem Relation Age of Onset  . Heart disease Mother   . Heart disease Father   . Heart disease Brother   . Heart disease Maternal Grandmother   . Heart disease Maternal Grandfather   . Stroke Maternal Grandfather   . Heart disease Paternal Grandmother   . Heart disease Paternal Grandfather   . Heart attack Paternal Grandfather     Social History Social History  Substance Use Topics  . Smoking status: Never Smoker  . Smokeless tobacco: Never Used  . Alcohol use No     Allergies   Cephalexin; Codeine; E-mycin [erythromycin base]; Elavil [amitriptyline]; Lidocaine; Neurontin [gabapentin]; Penicillins; Pravastatin; Sulfa drugs cross reactors; Tramadol; Darvon [propoxyphene]; and Mercury   Review of Systems Review of Systems  Gastrointestinal: Negative for nausea and vomiting.  Skin: Positive for wound.  Neurological: Positive for headaches. Negative for syncope.  All other systems reviewed and are negative.    Physical Exam Updated Vital Signs BP (!) 141/84 (BP Location: Right Arm)   Pulse 89   Temp 98.1 F (36.7 C) (Oral)   Resp 16   SpO2 98%   Physical Exam  Constitutional: She is oriented to person, place, and  time. She appears well-developed and well-nourished. No distress.  HENT:  Head: Normocephalic.    Right Ear: External ear normal.  Left Ear: External ear normal.  Mouth/Throat: Oropharynx is clear and moist.  Eyes: Conjunctivae and EOM are normal. Pupils are equal, round, and reactive to light.  Neck: Normal range of motion. Neck supple.  Cardiovascular: Normal rate, regular rhythm and normal heart sounds.   No murmur heard. Pulmonary/Chest: Effort normal and breath sounds normal. No respiratory distress.  Abdominal: Soft. She exhibits no distension. There is no tenderness.  Musculoskeletal: She exhibits no edema.  Neurological: She is alert and oriented to person, place, and time.  Speech clear and goal oriented. CN 2-12 grossly intact. Strength and sensation intact.  Skin: Skin is warm and dry.  Nursing note and vitals reviewed.    ED Treatments / Results  DIAGNOSTIC STUDIES: Oxygen Saturation is 98% on RA, normal by my interpretation.   COORDINATION OF CARE: 7:47 PM-Discussed next steps with pt which includes ice on her head.. Pt verbalized understanding and is agreeable with the plan.    Labs (all labs ordered are listed, but only abnormal results are displayed) Labs Reviewed - No data to display  EKG  EKG Interpretation None       Radiology Ct Head Wo Contrast  Result Date: 03/17/2017 CLINICAL DATA:  Status post fall, with injury to the left forehead. Hit posterior head on cement with prior fall. Left forehead pain. Initial encounter. EXAM: CT HEAD WITHOUT CONTRAST TECHNIQUE: Contiguous axial images were obtained from the base of the skull through the vertex without intravenous contrast. COMPARISON:  None. FINDINGS: Brain: No evidence of acute infarction, hemorrhage, hydrocephalus, extra-axial collection or mass lesion/mass effect. Prominence of the ventricles and sulci reflects mild to moderate cortical volume loss. Mild cerebellar atrophy is noted. Mild  periventricular white matter change likely reflects small vessel ischemic microangiopathy. The brainstem and fourth ventricle are within normal limits. The basal ganglia are unremarkable in appearance. The cerebral hemispheres demonstrate grossly normal gray-white differentiation. No mass effect or midline shift is seen. Vascular: No hyperdense vessel or unexpected calcification. Skull: There is no evidence of fracture; visualized osseous structures are unremarkable in appearance. Sinuses/Orbits: The orbits are within normal limits. The paranasal sinuses and mastoid air cells are well-aerated. Other: No significant soft tissue abnormalities are seen. IMPRESSION: 1. No acute intracranial pathology seen on CT. 2. Mild to moderate cortical volume loss and scattered small vessel ischemic microangiopathy. Electronically Signed   By: Garald Balding M.D.   On: 03/17/2017 18:59    Procedures Procedures (including critical care time)  Medications Ordered in ED Medications - No data to display   Initial Impression / Assessment and Plan / ED Course  I have reviewed the triage vital signs and the nursing notes.  Pertinent labs & imaging results that were available during my care of the patient were reviewed by me and considered in my medical decision making (see chart for details).    Rosalia Mcavoy is a 83 y.o. female who presents to ED for evaluation s/p mechanical fall just prior to arrival. Reassuring exam with no focal deficits. CT head negative. Family and patient feel comfortable with discharge to home, but did encourage them to follow up with neurologist to discuss frequent falls in the setting of her Parkinson's. Reasons to return to ER discussed and all questions answered.   Patient seen by and discussed with Dr. Sherry Ruffing who agrees with treatment plan.    Final Clinical Impressions(s) / ED Diagnoses   Final diagnoses:  Injury of head, initial encounter  Fall in home, initial encounter     New Prescriptions New Prescriptions   No medications on file   I personally performed the services described in this documentation, which was scribed in my presence. The recorded information has been reviewed and is accurate.     Ward, Ozella Almond, PA-C 03/17/17 2039    Tegeler, Gwenyth Allegra, MD 03/18/17 [phone]

## 2017-03-17 NOTE — ED Triage Notes (Signed)
Pt complaint of fall as trying to plug something into outlet resulting in contact to left forehead. Pt verbalizes requesting evaluation related to frequent falls. Today is 3rd in past two weeks. Two weeks ago fell resulting in contact to right posterior head; last week feel resulting in contact to right forehead. Pt alert and orient x4.

## 2017-03-17 NOTE — ED Notes (Signed)
Tegeler MD made aware of pt status and complaint; verbal orders placed.

## 2017-05-23 ENCOUNTER — Ambulatory Visit (INDEPENDENT_AMBULATORY_CARE_PROVIDER_SITE_OTHER): Payer: Medicare Other | Admitting: Diagnostic Neuroimaging

## 2017-05-23 ENCOUNTER — Encounter: Payer: Self-pay | Admitting: Diagnostic Neuroimaging

## 2017-05-23 VITALS — BP 132/76 | HR 83 | Ht 64.0 in | Wt 118.6 lb

## 2017-05-23 DIAGNOSIS — R42 Dizziness and giddiness: Secondary | ICD-10-CM

## 2017-05-23 DIAGNOSIS — G20 Parkinson's disease: Secondary | ICD-10-CM

## 2017-05-23 DIAGNOSIS — R269 Unspecified abnormalities of gait and mobility: Secondary | ICD-10-CM | POA: Diagnosis not present

## 2017-05-23 MED ORDER — CARBIDOPA-LEVODOPA 25-100 MG PO TABS: 2.0000 | ORAL_TABLET | Freq: Four times a day (QID) | ORAL | 4 refills | Status: DC

## 2017-05-23 NOTE — Patient Instructions (Signed)
-  continue current medications

## 2017-05-23 NOTE — Progress Notes (Signed)
PATIENT: Sarah Callahan DOB: 05-22-1931   REASON FOR VISIT: follow up for PD HISTORY FROM: patient  Chief Complaint  Patient presents with  . Follow-up  . Parkinson's Disease    using cane.  Her spouse passed away October 13, 2016 HA at 60y, Month of May had 4 falls, (one outside).  Hit her head 3 times.  Went to ED CT negative.     HISTORY OF PRESENT ILLNESS:  UPDATE 05/23/17: Since last visit, had a difficult May 2018; had multiple falls on to the cement and on to the coffee table. Was not using rollator at that time. More stress in Apr and May 2018. Went to ER, and CT head negative. Now feeling better. Now using the rollator more and not having any falls. Tolerating carb/levo. No wearing off.  UPDATE 11/21/16: Since last visit, pt is stable with PD. She has had some falls, but no major injuries. Using a cane and her scooter. Sometimes uses walker. Also unfortunately pt's husband passed away in 11-07-2016 from heart attack (age 55 yrs old). Pt living at home with grand-daughter and son-in-law. More fatigue in early AM. Some recent positional vertigo when getting up in the AM. No nausea or vomiting.   UPDATE 05/15/16: Since last visit, doing fair; some wearing off after 7:30am dose --> by 10:30-11 feels wearing off. Afternoon is better. Sleep and gait otherwise stable.   UPDATE 11/17/15: Since last visit, doing well on carb/levo 1.5 tabs QID. Overall doing well. 2-3 wks ago, stood up quickly, then fell backwards and thinks she passed out. Her family was there and caught her from falling to ground. She regained consciousness quickly. No major injuries or post ictal confusion.   UPDATE 12/07/14 (VRP): Since last visit, she feel stable from PD standpoint. No wearing off. Some nightmares and restless legs at night. Still with stress related to family issues. Now only husband and great-grand-daughter are at home.   UPDATE 09/04/14 (VRP): Since last visit, feels better. Taking carb/levo (1.5/1.5/1/1) and  doing better. Still with tremor, slowness, stiffness. Still with balance diff. No falls. Uses a walker at times. Her husband had a "light stroke" in July 2015, with good recovery. He does most of driving. Patient still drives a little (daytime). Patient's grand-daughter (and her boyfriend) and great-grand-daughter live with them. Patient has daughter and another granddaughter who could help her in the future if her health declines.   UPDATE 03/02/14 (VRP): Since last visit, she notes that her PD symptoms are more significant in AM (with dose 1 and 2) but sig improve with doses 3 and 4. Now having intermittent dystonic posturing of left foot (previously only in right foot). Some restless legs at night. No falls. Uses a scooter or shopping cart for longer distances. Able to get around at home holding on to walls etc.   UPDATE 10/13/13 (LL):  Since last visit, she started increasing Sinemet from TID to QID, with good results.  Sometimes she is so busy she forgets to take extra dose.  Dystonia is the same in right foot, but she would rather hold off on starting another medication until she has to.  She has custody of her great-granddaughter who is 7 and has ADHD.  UPDATE 07/10/13 (VP): Since last visit, patient is noted some wearing off between dosages. She also is noted dystonia in the right foot. This has been happening over the past 2-3 months. She also has had increasing falls and balance problems. Patient is alone for this  visit. She is not using a walker or cane.   UPDATE 05/29/12: Doing well. Taking carb/levo TID. No wearing off. No dyskinesias. She is ahppy with current dosing. PT has helped. Uses a cane for distance walking.   UPDATE 12/22/11: Doing better on carb/levo. notices tremor is worse if she misses a dose. Still with balance dif, and has fallen down. Also found that ther gas fireplace was leaking carbon monoide into the home. Now replaced, and her fatigue is better.   PRIOR HPI: 83 year old  right-handed female with history of diabetes, hypothyroidism, osteoporosis, here for evaluation of tremor. patient reports progressive right upper extremity tremor for the past 1-1/2 years. Tremor is mainly present at rest but sometimes when she holds a cup or utensils. Tremor worsens with stress. Patient also reports unsteady gait and balance, intermittent drooling, and constipation. No significant change in sense of taste or smell. No reports of sleep disturbance or hallucination. No family history of Parkinson's disease.    REVIEW OF SYSTEMS: Full 14 system review of systems performed and negative except: dizziness tremors depression constipation.   ALLERGIES: Allergies  Allergen Reactions  . Cephalexin Other (See Comments)    Gets UTI after taking  . Codeine Itching  . E-Mycin [Erythromycin Base] Nausea And Vomiting  . Elavil [Amitriptyline] Other (See Comments)  . Lidocaine Other (See Comments)    Feels hot, turns red  . Neurontin [Gabapentin] Other (See Comments)    Depression  . Penicillins Hives  . Pravastatin Other (See Comments)    Bone and joint pain  . Sulfa Drugs Cross Reactors Hives  . Tramadol Other (See Comments)    Muscle pain  . Darvon [Propoxyphene] Rash  . Mercury Rash    HOME MEDICATIONS: Outpatient Medications Prior to Visit  Medication Sig Dispense Refill  . carbidopa-levodopa (SINEMET IR) 25-100 MG tablet Take 2 tablets by mouth 4 (four) times daily. 720 tablet 4  . cholecalciferol (VITAMIN D) 1000 UNITS tablet Take 6,000 Units by mouth daily.     . Coenzyme Q10 (COQ-10 PO) Take 1 tablet by mouth 2 (two) times daily.    Marland Kitchen ESTRACE VAGINAL 0.1 MG/GM vaginal cream Place 1 Applicatorful vaginally once a week.   0  . Flaxseed, Linseed, (FLAX SEED OIL PO) Take 1 tablet by mouth daily.    . fluticasone (FLONASE) 50 MCG/ACT nasal spray Place 2 sprays into both nostrils daily.     . furosemide (LASIX) 20 MG tablet Take 1 tablet by mouth daily.    Marland Kitchen levothyroxine  (SYNTHROID, LEVOTHROID) 75 MCG tablet Take 75 mcg by mouth daily.    . Melatonin 3 MG TABS Take 3 mg by mouth daily.    Marland Kitchen PARoxetine (PAXIL) 20 MG tablet Take 20 mg by mouth daily. One tablet daily    . Thiamine HCl (VITAMIN B-1 PO) Take 1 tablet by mouth daily.    . vitamin E (VITAMIN E) 200 UNIT capsule Take 200 Units by mouth daily.    . Cyanocobalamin (VITAMIN B-12 PO) Take 1 tablet by mouth daily.    Marland Kitchen omeprazole (PRILOSEC) 40 MG capsule Take 40 mg by mouth daily.     No facility-administered medications prior to visit.     PAST MEDICAL HISTORY: Past Medical History:  Diagnosis Date  . Arthritis   . Cancer (HCC)    forehead, basal cell  . Diabetes mellitus    diet controlled  . Dyspepsia   . GERD (gastroesophageal reflux disease)   . H/O: rheumatic fever   .  Hypercalcemia   . Hypercholesteremia   . Hypothyroidism   . Macular degeneration   . OA (osteoarthritis of spine)    , Back  . Osteoporosis   . Osteoporosis   . Pancreatitis   . Parkinson disease ([place])   . Peripheral edema   . PONV (postoperative nausea and vomiting)   . Thoracic compression fracture ([place])   . Vitamin D deficiency   . Wears glasses     PAST SURGICAL HISTORY: Past Surgical History:  Procedure Laterality Date  . ABDOMINAL HYSTERECTOMY    . APPENDECTOMY    . CARPAL TUNNEL RELEASE     both  . CATARACT EXTRACTION    . COLONOSCOPY    . EYE SURGERY     both cataracts  . FOOT SURGERY  2009   hammer toes  . HAND SURGERY    . HEMORRHOID SURGERY    . MOHS SURGERY Right 09/2015   forehead, basal cell  . TONSILLECTOMY    . TRIGGER FINGER RELEASE Right 04/30/2013   Procedure: RELEASE TRIGGER FINGER/A-1 PULLEY RIGHT RING FINGER;  Surgeon: Wynonia Sours, MD;  Location: [facility];  Service: Orthopedics;  Laterality: Right;    FAMILY HISTORY: Family History  Problem Relation Age of Onset  . Heart disease Mother   . Heart disease Father   . Heart disease Brother   . Heart disease  Maternal Grandmother   . Heart disease Maternal Grandfather   . Stroke Maternal Grandfather   . Heart disease Paternal Grandmother   . Heart disease Paternal Grandfather   . Heart attack Paternal Grandfather     SOCIAL HISTORY: Social History   Social History  . Marital status: Married    Spouse name: N/A  . Number of children: 3  . Years of education: College   Occupational History  .      n/a   Social History Main Topics  . Smoking status: Never Smoker  . Smokeless tobacco: Never Used  . Alcohol use No  . Drug use: No  . Sexual activity: Not on file   Other Topics Concern  . Not on file   Social History Narrative   Patient lives at home with and great-granddaughter.    3 children, 1 deceased    Caffeine Use: 2-3 cups of coffee in the a.m. daily     PHYSICAL EXAM  Vitals:   05/23/17 0913  BP: 132/76  Pulse: 83  Weight: 118 lb 9.6 oz (53.8 kg)  Height: 5\' 4"  (1.626 m)   No data found.  Body mass index is 20.36 kg/m.  EXAM:  General: Patient is awake, alert and in no acute distress. Well developed and groomed. POSITIVE MYERSON'S. MASKED FACIES.  Neck: Neck is supple.  Cardiovascular: No carotid artery bruits. Heart is regular rate and rhythm with no murmurs.   Neurologic Exam  Mental Status: Awake, alert. Language is fluent and comprehension intact.  Cranial Nerves: Pupils are equal and reactive to light. Visual fields are full to confrontation. Conjugate eye movements are full and symmetric. Facial sensation and strength are symmetric. Hearing is intact. Palate elevated symmetrically and uvula is midline. Shoulder shrug is symmetric. Tongue is midline. SOFT, HOARSE VOICE.  Motor: NO RESTING TREMOR OF RUE. COGWHEELING IN RUE > LUE. BRADYKINESIA IN BUE AND BLE. Full strength in the upper and lower extremities. No pronator drift.  Sensory: Intact and symmetric to light touch  Coordination: No ataxia or dysmetria on finger-nose or rapid alternating movement  testing.  Gait and Station: STOOPED POSTURE. USING SINGLE POINT CANE; MODERATE UNSTEADY GAIT. SIGNIFICANT KYPHOSIS-SCOLIOSIS. POOR ARM SWING. SLOW, SHORT STEPS. Reflexes: Deep tendon reflexes in the upper and lower extremity are present and symmetric. ABSENT AT ANKLES.  DIAGNOSTIC DATA (LABS, IMAGING, TESTING) - I reviewed patient records, labs, notes, testing and imaging myself where available.  Lab Results  Component Value Date   WBC 5.6 12/16/2013   HGB 12.6 12/16/2013   HCT 38.9 12/16/2013   MCV 93.3 12/16/2013   PLT 224.0 12/16/2013      Component Value Date/Time   NA 141 04/30/2013 0820   K 4.0 04/30/2013 0820   CL 99 04/30/2013 0820   CO2 30 09/07/2008 1600   GLUCOSE 122* 04/30/2013 0820   BUN 18 04/30/2013 0820   CREATININE 0.70 04/30/2013 0820   CALCIUM 9.8 09/07/2008 1600   GFRNONAA >60 09/07/2008 1600   GFRAA  Value: >60   09/07/2008 1600    10/26/11 MRI brain - mild bitemporal atrophy    ASSESSMENT AND PLAN 83 y.o. female with diabetes, hypothyroidism, osteoporosis, with progressive right upper extremity resting tremor. She has cogwheel rigidity, bradykinesia, stooped posture, and Myerson's sign on exam. Continued progression of disease. Had orthostatic hypotension event / syncope in Jan 2017, due to rapidly standing. Has significant gait difficulty.    Dx: idiopathic Parkinson's disease   Parkinson's disease ([place])  Dizziness and giddiness  Gait difficulty   PLAN:   I spent 25 minutes of face to face time with patient. Greater than 50% of time was spent in counseling and coordination of care with patient. In summary we discussed:   PARKINSON'S DISEASE (established problem, worsening)  - continue carbidopa-levodopa dosing: 7:30am - 2 tabs 11:30am - 2 tabs 3:30pm - 2 tabs 7:30pm - 2 tabs  - in future may include adding azilect vs. entacapone vs. neupro patch  - encouraged patient to use rollator walker at all times for fall prevention  Meds ordered this  encounter  Medications  . carbidopa-levodopa (SINEMET IR) 25-100 MG tablet    Sig: Take 2 tablets by mouth 4 (four) times daily.    Dispense:  720 tablet    Refill:  4   Return in about 6 months (around 11/23/2017).    Penni Bombard, MD 05/07/6377, 58:85 AM Certified in Neurology, Neurophysiology and Neuroimaging  [facility] [address] [place], [place] [address] [phone]

## 2017-06-27 ENCOUNTER — Telehealth: Payer: Self-pay | Admitting: *Deleted

## 2017-06-27 NOTE — Telephone Encounter (Signed)
NOTES SENT TO SCHEDULING.  °

## 2017-08-29 ENCOUNTER — Encounter: Payer: Self-pay | Admitting: Cardiology

## 2017-08-29 ENCOUNTER — Ambulatory Visit (INDEPENDENT_AMBULATORY_CARE_PROVIDER_SITE_OTHER): Payer: Medicare Other | Admitting: Cardiology

## 2017-08-29 VITALS — BP 102/56 | HR 89 | Resp 16 | Ht 64.0 in | Wt 123.2 lb

## 2017-08-29 DIAGNOSIS — R079 Chest pain, unspecified: Secondary | ICD-10-CM

## 2017-08-29 DIAGNOSIS — G20 Parkinson's disease: Secondary | ICD-10-CM

## 2017-08-29 DIAGNOSIS — E1142 Type 2 diabetes mellitus with diabetic polyneuropathy: Secondary | ICD-10-CM

## 2017-08-29 DIAGNOSIS — R0789 Other chest pain: Secondary | ICD-10-CM

## 2017-08-29 NOTE — Patient Instructions (Signed)
Medication Instructions:  The current medical regimen is effective;  continue present plan and medications.  Testing/Procedures: Your physician has requested that you have a lexiscan myoview. For further information please visit [email or web address]. Please follow instruction sheet, as given.  Follow-Up: Follow up as needed after the above testing.  Thank you for choosing [facility]!!

## 2017-08-29 NOTE — Progress Notes (Signed)
Cardiology Office Note:    Date:  08/29/2017   ID:  Sarah Callahan, DOB 1930/12/18, MRN [id number]  PCP:  Harlan Stains, MD  Cardiologist:  Candee Furbish, MD    Referring MD: Harlan Stains, MD     History of Present Illness:    Sarah Callahan is a 83 y.o. female who I have seen several years ago here with episodes of chest pain, atypical.  Has risk factors of diabetes, also has Parkinson's disease.  In review of clinic note from Dr. Harlan Stains on 06/25/17 she had woken up with indigestion-like pain in chest and right shoulder. So severe. Hard to get a deep breath.  She sat up, was dizzy, laid back down and then sat back up a little bit later and she was feeling well.  Her blood pressure was checked shortly thereafter and it was 179/83 then 168/95.  EMS came to the house, monitored her and blood pressure was then 130/70.  She did not wish to go to the emergency room.  No recent episodes.  Husband had pain like this in the past and had stoke and died, Sarah Callahan was his name.  He was 92 at that time.  She raised her great granddaughter, Sarah Callahan is 96, [place].   Past Medical History:  Diagnosis Date  . Arthritis   . Cancer (HCC)    forehead, basal cell  . Diabetes mellitus    diet controlled  . Dyspepsia   . GERD (gastroesophageal reflux disease)   . H/O: rheumatic fever   . Hypercalcemia   . Hypercholesteremia   . Hypothyroidism   . Macular degeneration   . OA (osteoarthritis of spine)    , Back  . Osteoporosis   . Osteoporosis   . Pancreatitis   . Parkinson disease ([place])   . Peripheral edema   . PONV (postoperative nausea and vomiting)   . Thoracic compression fracture ([place])   . Vitamin D deficiency   . Wears glasses     Past Surgical History:  Procedure Laterality Date  . ABDOMINAL HYSTERECTOMY    . APPENDECTOMY    . CARPAL TUNNEL RELEASE     both  . CATARACT EXTRACTION    . COLONOSCOPY    . EYE SURGERY     both cataracts  . FOOT SURGERY  2009   hammer toes   . HAND SURGERY    . HEMORRHOID SURGERY    . MOHS SURGERY Right 09/2015   forehead, basal cell  . TONSILLECTOMY    . TRIGGER FINGER RELEASE Right 04/30/2013   Procedure: RELEASE TRIGGER FINGER/A-1 PULLEY RIGHT RING FINGER;  Surgeon: Wynonia Sours, MD;  Location: [facility];  Service: Orthopedics;  Laterality: Right;    Current Medications: Current Meds  Medication Sig  . Bioflavonoid Products (VITAMIN C) CHEW Chew by mouth 2 (two) times daily.  . carbidopa-levodopa (SINEMET IR) 25-100 MG tablet Take 2 tablets by mouth 4 (four) times daily.  . Cholecalciferol (VITAMIN D-3) 5000 units TABS Take 1 tablet by mouth 2 (two) times daily.  . Coenzyme Q10 (COQ-10 PO) Take 1 tablet by mouth 2 (two) times daily.  . DHA-EPA-Vitamin E (OMEGA-3 COMPLEX PO) Take 1 capsule by mouth daily.  Marland Kitchen ESTRACE VAGINAL 0.1 MG/GM vaginal cream Place 1 Applicatorful vaginally once a week.   . Flaxseed, Linseed, (FLAX SEED OIL PO) Take 1 tablet by mouth daily.  . fluticasone (FLONASE) 50 MCG/ACT nasal spray Place 2 sprays into both nostrils daily.   Marland Kitchen  furosemide (LASIX) 20 MG tablet Take 1 tablet by mouth daily.  Marland Kitchen levothyroxine (SYNTHROID, LEVOTHROID) 75 MCG tablet Take 75 mcg by mouth daily.  . Magnesium 250 MG TABS Take 1 tablet by mouth daily.  . Multiple Vitamins-Minerals (HEALTHY EYES PO) Take 1 capsule by mouth 2 (two) times daily.  . Multiple Vitamins-Minerals (MULTI-VITAMIN GUMMIES PO) Take 2 tablets by mouth daily.  Marland Kitchen nystatin (NYSTATIN) powder Apply topically 2 (two) times daily as needed.  Marland Kitchen PARoxetine (PAXIL) 20 MG tablet Take 20 mg by mouth daily. One tablet daily  . Thiamine HCl (VITAMIN B-1 PO) Take 1 tablet by mouth daily.  . vitamin E (VITAMIN E) 200 UNIT capsule Take 200 Units by mouth daily.  . [DISCONTINUED] cholecalciferol (VITAMIN D) 1000 UNITS tablet Take 6,000 Units by mouth daily.      Allergies:   Cephalexin; Codeine; E-mycin [erythromycin base]; Elavil [amitriptyline];  Fosamax [alendronate]; Influenza virus vaccine split; Iodine; Lidocaine; Neurontin [gabapentin]; Novocain [procaine]; Penicillins; Pravastatin; Sulfa drugs cross reactors; Tramadol; Darvon [propoxyphene]; and Mercury   Social History   Social History  . Marital status: Married    Spouse name: Sarah Callahan  . Number of children: 3  . Years of education: College   Occupational History  . retired     n/a   Social History Main Topics  . Smoking status: Never Smoker  . Smokeless tobacco: Never Used  . Alcohol use No  . Drug use: No  . Sexual activity: Not on file   Other Topics Concern  . Not on file   Social History Narrative   Patient lives at home with and great-granddaughter.    3 children, 1 deceased    Caffeine Use: 2-3 cups of coffee in the a.m. daily     Family History: The patient's family history includes Heart attack in her paternal grandfather; Heart disease in her brother, father, maternal grandfather, maternal grandmother, mother, paternal grandfather, and paternal grandmother; Stroke in her maternal grandfather.   ROS:   Please see the history of present illness.    Denies any fevers chills orthopnea PND syncope, positive chest pain, hearing loss, dizziness, depression bruising, anxiety, fatigue, balance issues.  All other systems reviewed and are negative.  EKGs/Labs/Other Studies Reviewed:    The following studies were reviewed today: Prior office notes reviewed, lab work reviewed, EKG reviewed  EKG:  EKG is not ordered today.  The ekg from 06/25/17 showed sinus rhythm with no other abnormalities, minimal poor R wave progression noted.  Personally viewed  Recent Labs:  Total cholesterol 192, HDL 77, LDL 96, triglycerides 97, hemoglobin A1c 7.2, hemoglobin 12.8, creatinine 0.67, potassium 4.5, TSH 1.4  No results found for requested labs within last 8760 hours.  Recent Lipid Panel No results found for: CHOL, TRIG, HDL, CHOLHDL, VLDL, LDLCALC, LDLDIRECT  Physical  Exam:    VS:  BP (!) 102/56   Pulse 89   Resp 16   Ht 5\' 4"  (1.626 m)   Wt 123 lb 3.2 oz (55.9 kg)   SpO2 97%   BMI 21.15 kg/m     Wt Readings from Last 3 Encounters:  08/29/17 123 lb 3.2 oz (55.9 kg)  05/23/17 118 lb 9.6 oz (53.8 kg)  11/21/16 121 lb (54.9 kg)     GEN: Elderly appearing,  well nourished, well developed in no acute distress HEENT: Normal NECK: No JVD; No carotid bruits LYMPHATICS: No lymphadenopathy CARDIAC: RRR, no murmurs, rubs, gallops RESPIRATORY:  Clear to auscultation without rales, wheezing or rhonchi  ABDOMEN: Soft, non-tender, non-distended MUSCULOSKELETAL:  No edema; No deformity  SKIN: Warm and dry NEUROLOGIC:  Alert and oriented x 3 PSYCHIATRIC:  Normal affect   ASSESSMENT:    1. Other chest pain   2. Parkinson's disease ([place])   3. Type 2 diabetes mellitus with peripheral neuropathy (HCC)   4. Chest pain, unspecified type    PLAN:    In order of problems listed above:  Chest pain  -We will go ahead and order a Lexiscan pharmacologic stress test.  She states that she will be able to lay still for the exam.  Differential diagnosis includes musculoskeletal, anxiety, GERD.  She has been struggling with grief after her husband died from stroke.  She states that she does have anxiety surrounding this.  Parkinson's disease  -Medications reviewed.  Following up with neurology.  Diabetes with peripheral neuropathy  -Has been closely monitored by Dr. Dema Severin.    Medication Adjustments/Labs and Tests Ordered: Current medicines are reviewed at length with the patient today.  Concerns regarding medicines are outlined above.  Orders Placed This Encounter  Procedures  . Myocardial Perfusion Imaging   No orders of the defined types were placed in this encounter.   Signed, Candee Furbish, MD  08/29/2017 11:20 AM    [facility]

## 2017-08-31 ENCOUNTER — Telehealth: Payer: Self-pay | Admitting: Cardiology

## 2017-08-31 NOTE — Telephone Encounter (Signed)
New message    Patient concerned about not taking medication the day of  NM2/TREAD myocardial perfusion. Please call.  Pt c/o medication issue:  1. Name of Medication: carbidopa-levodopa (SINEMET IR) 25-100 MG tablet  2. How are you currently taking this medication (dosage and times per day)? Take 2 tablets by mouth 4 (four) times daily.  3. Are you having a reaction (difficulty breathing--STAT)? no  4. What is your medication issue? Patient wants to know how long she will have to wait before taking medication again.

## 2017-08-31 NOTE — Telephone Encounter (Signed)
Spoke with patient who is aware OK for her to take Levodopa this day.  Advised she could and probably should hold her Furosemide this am however all other medications are OK to take.  Pt states understanding and will call back if further questions or concerns.

## 2017-09-03 ENCOUNTER — Telehealth (HOSPITAL_COMMUNITY): Payer: Self-pay | Admitting: *Deleted

## 2017-09-03 NOTE — Telephone Encounter (Signed)
Left message on voicemail per DPR in reference to upcoming appointment scheduled on 09/07/17 at 0915 with detailed instructions given per Myocardial Perfusion Study Information Sheet for the test. LM to arrive 15 minutes early, and that it is imperative to arrive on time for appointment to keep from having the test rescheduled. If you need to cancel or reschedule your appointment, please call the office within 24 hours of your appointment. Failure to do so may result in a cancellation of your appointment, and a $50 no show fee. Phone number given for call back for any questions.

## 2017-09-04 ENCOUNTER — Telehealth (HOSPITAL_COMMUNITY): Payer: Self-pay | Admitting: *Deleted

## 2017-09-04 NOTE — Telephone Encounter (Signed)
Patient given detailed instructions per Myocardial Perfusion Study Information Sheet for the test on 09/07/17 at 0915. Patient notified to arrive 15 minutes early and that it is imperative to arrive on time for appointment to keep from having the test rescheduled.  If you need to cancel or reschedule your appointment, please call the office within 24 hours of your appointment. . Patient verbalized understanding.Sarah Callahan, Ranae Palms

## 2017-09-07 ENCOUNTER — Ambulatory Visit (HOSPITAL_COMMUNITY): Payer: Medicare Other | Attending: Cardiology

## 2017-09-07 DIAGNOSIS — R079 Chest pain, unspecified: Secondary | ICD-10-CM

## 2017-09-07 DIAGNOSIS — R9439 Abnormal result of other cardiovascular function study: Secondary | ICD-10-CM | POA: Diagnosis not present

## 2017-09-07 LAB — MYOCARDIAL PERFUSION IMAGING
CHL CUP NUCLEAR SDS: 10
CHL CUP RESTING HR STRESS: 76 {beats}/min
LHR: 0.26
LVDIAVOL: 57 mL (ref 46–106)
LVSYSVOL: 19 mL
Peak HR: 96 {beats}/min
SRS: 4
SSS: 14
TID: 1.05

## 2017-09-07 MED ORDER — TECHNETIUM TC 99M TETROFOSMIN IV KIT
10.7000 | PACK | Freq: Once | INTRAVENOUS | Status: AC | PRN
  Administered 2017-09-07: 10.7 via INTRAVENOUS
  Filled 2017-09-07: qty 11

## 2017-09-07 MED ORDER — TECHNETIUM TC 99M TETROFOSMIN IV KIT
32.6000 | PACK | Freq: Once | INTRAVENOUS | Status: AC | PRN
  Administered 2017-09-07: 32.6 via INTRAVENOUS
  Filled 2017-09-07: qty 33

## 2017-09-07 MED ORDER — REGADENOSON 0.4 MG/5ML IV SOLN
0.4000 mg | Freq: Once | INTRAVENOUS | Status: AC
  Administered 2017-09-07: 0.4 mg via INTRAVENOUS

## 2017-09-11 ENCOUNTER — Telehealth: Payer: Self-pay | Admitting: *Deleted

## 2017-09-11 NOTE — Telephone Encounter (Signed)
Stopped by and wanted a handicap placard for herself.  She has PD.  Filled out and signed by Dr. Leta Baptist. Placed upfront to pt to pick up.

## 2017-11-27 ENCOUNTER — Ambulatory Visit: Payer: Medicare Other | Admitting: Diagnostic Neuroimaging

## 2017-11-27 ENCOUNTER — Encounter: Payer: Self-pay | Admitting: Diagnostic Neuroimaging

## 2017-11-27 VITALS — BP 128/68 | HR 86 | Ht 64.0 in | Wt 120.2 lb

## 2017-11-27 DIAGNOSIS — R269 Unspecified abnormalities of gait and mobility: Secondary | ICD-10-CM | POA: Diagnosis not present

## 2017-11-27 DIAGNOSIS — G20 Parkinson's disease: Secondary | ICD-10-CM | POA: Diagnosis not present

## 2017-11-27 DIAGNOSIS — R42 Dizziness and giddiness: Secondary | ICD-10-CM

## 2017-11-27 MED ORDER — CARBIDOPA-LEVODOPA 25-100 MG PO TABS: 2.0000 | ORAL_TABLET | Freq: Four times a day (QID) | ORAL | 4 refills | Status: DC

## 2017-11-27 NOTE — Progress Notes (Signed)
PATIENT: Sarah Callahan DOB: 1931/04/07   REASON FOR VISIT: follow up for PD HISTORY FROM: patient  Chief Complaint  Patient presents with  . Follow-up  . Parkinsons Disease    stable most of time, noted some worsening changes 09/2017, falls (7).  Using cane and scooter, and rollator walker.   Now has family with living with her.       HISTORY OF PRESENT ILLNESS:  UPDATE (11/27/17, VRP): Since last visit, doing well. Tolerating meds. Now on carb/levo 25/100 (9 tabs divided over 4 times per day). No alleviating or aggravating factors. Has had more falls since last visit, but these occurred when she was note using cane or walker, and she was "doing things I am not supposed to do" such as cleaning up in the basement, bending and lifting items etc. When she uses cane and rollator, she does well. Now grand-daughter Sarah Callahan) and her friends (a married couple) have moved in with patient and great-grand daughter, and this arrangement is helping a lot.   UPDATE 05/23/17: Since last visit, had a difficult May 2018; had multiple falls on to the cement and on to the coffee table. Was not using rollator at that time. More stress in Apr and May 2018. Went to ER, and CT head negative. Now feeling better. Now using the rollator more and not having any falls. Tolerating carb/levo. No wearing off.  UPDATE 11/21/16: Since last visit, pt is stable with PD. She has had some falls, but no major injuries. Using a cane and her scooter. Sometimes uses walker. Also unfortunately pt's husband passed away in 20-Nov-2016 from heart attack (age 34 yrs old). Pt living at home with grand-daughter and son-in-law. More fatigue in early AM. Some recent positional vertigo when getting up in the AM. No nausea or vomiting.   UPDATE 05/15/16: Since last visit, doing fair; some wearing off after 7:30am dose --> by 10:30-11 feels wearing off. Afternoon is better. Sleep and gait otherwise stable.   UPDATE 11/17/15: Since last visit, doing  well on carb/levo 1.5 tabs QID. Overall doing well. 2-3 wks ago, stood up quickly, then fell backwards and thinks she passed out. Her family was there and caught her from falling to ground. She regained consciousness quickly. No major injuries or post ictal confusion.   UPDATE 12/07/14 (VRP): Since last visit, she feel stable from PD standpoint. No wearing off. Some nightmares and restless legs at night. Still with stress related to family issues. Now only husband and great-grand-daughter are at home.   UPDATE 09/04/14 (VRP): Since last visit, feels better. Taking carb/levo (1.5/1.5/1/1) and doing better. Still with tremor, slowness, stiffness. Still with balance diff. No falls. Uses a walker at times. Her husband had a "light stroke" in July 2015, with good recovery. He does most of driving. Patient still drives a little (daytime). Patient's grand-daughter (and her boyfriend) and great-grand-daughter live with them. Patient has daughter and another granddaughter who could help her in the future if her health declines.   UPDATE 03/02/14 (VRP): Since last visit, she notes that her PD symptoms are more significant in AM (with dose 1 and 2) but sig improve with doses 3 and 4. Now having intermittent dystonic posturing of left foot (previously only in right foot). Some restless legs at night. No falls. Uses a scooter or shopping cart for longer distances. Able to get around at home holding on to walls etc.   UPDATE 10/10/13 (LL):  Since last visit, she started increasing  Sinemet from TID to QID, with good results.  Sometimes she is so busy she forgets to take extra dose.  Dystonia is the same in right foot, but she would rather hold off on starting another medication until she has to.  She has custody of her great-granddaughter who is 7 and has ADHD.  UPDATE 07/10/13 (VP): Since last visit, patient is noted some wearing off between dosages. She also is noted dystonia in the right foot. This has been happening over  the past 2-3 months. She also has had increasing falls and balance problems. Patient is alone for this visit. She is not using a walker or cane.   UPDATE 05/29/12: Doing well. Taking carb/levo TID. No wearing off. No dyskinesias. She is happy with current dosing. PT has helped. Uses a cane for distance walking.   UPDATE 12/22/11: Doing better on carb/levo. notices tremor is worse if she misses a dose. Still with balance diff, and has fallen down. Also found that ther gas fireplace was leaking carbon monoide into the home. Now replaced, and her fatigue is better.   PRIOR HPI: 84 year old right-handed female with history of diabetes, hypothyroidism, osteoporosis, here for evaluation of tremor. patient reports progressive right upper extremity tremor for the past 1-1/2 years. Tremor is mainly present at rest but sometimes when she holds a cup or utensils. Tremor worsens with stress. Patient also reports unsteady gait and balance, intermittent drooling, and constipation. No significant change in sense of taste or smell. No reports of sleep disturbance or hallucination. No family history of Parkinson's disease.    REVIEW OF SYSTEMS: Full 14 system review of systems performed and negative except: cramps walking dizziness tremors depression constipation.   ALLERGIES: Allergies  Allergen Reactions  . Cephalexin Other (See Comments)    Gets UTI after taking  . Codeine Itching  . E-Mycin [Erythromycin Base] Nausea And Vomiting  . Elavil [Amitriptyline] Other (See Comments)  . Fosamax [Alendronate] Other (See Comments)    Body aches  . Influenza Virus Vaccine Split Other (See Comments)    Side effects  . Iodine Other (See Comments)    infection  . Lidocaine Other (See Comments)    Feels hot, turns red  . Neurontin [Gabapentin] Other (See Comments)    Depression  . Novocain [Procaine] Other (See Comments)  . Penicillins Hives  . Pravastatin Other (See Comments)    Bone and joint pain  . Sulfa  Drugs Cross Reactors Hives  . Tramadol Other (See Comments)    Muscle pain  . Darvon [Propoxyphene] Rash  . Mercury Rash    HOME MEDICATIONS: Outpatient Medications Prior to Visit  Medication Sig Dispense Refill  . Bioflavonoid Products (VITAMIN C) CHEW Chew by mouth 2 (two) times daily.    . carbidopa-levodopa (SINEMET IR) 25-100 MG tablet Take 2 tablets by mouth 4 (four) times daily. 720 tablet 4  . Cholecalciferol (VITAMIN D-3) 5000 units TABS Take 1 tablet by mouth 2 (two) times daily.    . Coenzyme Q10 (COQ-10 PO) Take 1 tablet by mouth 2 (two) times daily.    . DHA-EPA-Vitamin E (OMEGA-3 COMPLEX PO) Take 1 capsule by mouth daily.    Marland Kitchen ESTRACE VAGINAL 0.1 MG/GM vaginal cream Place 1 Applicatorful vaginally once a week.   0  . Flaxseed, Linseed, (FLAX SEED OIL PO) Take 1 tablet by mouth daily.    . fluticasone (FLONASE) 50 MCG/ACT nasal spray Place 2 sprays into both nostrils daily.     . furosemide (LASIX) 20  MG tablet Take 1 tablet by mouth daily.    Marland Kitchen levothyroxine (SYNTHROID, LEVOTHROID) 75 MCG tablet Take 75 mcg by mouth daily.    . Magnesium 250 MG TABS Take 1 tablet by mouth daily.    . Multiple Vitamins-Minerals (HEALTHY EYES PO) Take 1 capsule by mouth 2 (two) times daily.    . Multiple Vitamins-Minerals (MULTI-VITAMIN GUMMIES PO) Take 2 tablets by mouth daily.    Marland Kitchen nystatin (NYSTATIN) powder Apply topically 2 (two) times daily as needed.    Marland Kitchen PARoxetine (PAXIL) 20 MG tablet Take 20 mg by mouth daily. One tablet daily    . Thiamine HCl (VITAMIN B-1 PO) Take 1 tablet by mouth daily.    . vitamin E (VITAMIN E) 200 UNIT capsule Take 200 Units by mouth daily.     No facility-administered medications prior to visit.     PAST MEDICAL HISTORY: Past Medical History:  Diagnosis Date  . Arthritis   . Cancer (HCC)    forehead, basal cell  . Diabetes mellitus    diet controlled  . Dyspepsia   . GERD (gastroesophageal reflux disease)   . H/O: rheumatic fever   .  Hypercalcemia   . Hypercholesteremia   . Hypothyroidism   . Macular degeneration   . OA (osteoarthritis of spine)    , Back  . Osteoporosis   . Osteoporosis   . Pancreatitis   . Parkinson disease ([place])   . Peripheral edema   . PONV (postoperative nausea and vomiting)   . Thoracic compression fracture ([place])   . Vitamin D deficiency   . Wears glasses     PAST SURGICAL HISTORY: Past Surgical History:  Procedure Laterality Date  . ABDOMINAL HYSTERECTOMY    . APPENDECTOMY    . CARPAL TUNNEL RELEASE     both  . CATARACT EXTRACTION    . COLONOSCOPY    . EYE SURGERY     both cataracts  . FOOT SURGERY  2009   hammer toes  . HAND SURGERY    . HEMORRHOID SURGERY    . MOHS SURGERY Right 09/2015   forehead, basal cell  . TONSILLECTOMY    . TRIGGER FINGER RELEASE Right 04/30/2013   Procedure: RELEASE TRIGGER FINGER/A-1 PULLEY RIGHT RING FINGER;  Surgeon: Wynonia Sours, MD;  Location: [facility];  Service: Orthopedics;  Laterality: Right;    FAMILY HISTORY: Family History  Problem Relation Age of Onset  . Heart disease Mother   . Heart disease Father   . Heart disease Brother   . Heart disease Maternal Grandmother   . Heart disease Maternal Grandfather   . Stroke Maternal Grandfather   . Heart disease Paternal Grandmother   . Heart disease Paternal Grandfather   . Heart attack Paternal Grandfather     SOCIAL HISTORY: Social History   Socioeconomic History  . Marital status: Married    Spouse name: Barnabas Lister  . Number of children: 3  . Years of education: College  . Highest education level: Not on file  Social Needs  . Financial resource strain: Not on file  . Food insecurity - worry: Not on file  . Food insecurity - inability: Not on file  . Transportation needs - medical: Not on file  . Transportation needs - non-medical: Not on file  Occupational History  . Occupation: retired    Comment: n/a  Tobacco Use  . Smoking status: Never Smoker  . Smokeless  tobacco: Never Used  Substance and Sexual Activity  .  Alcohol use: No  . Drug use: No  . Sexual activity: Not on file  Other Topics Concern  . Not on file  Social History Narrative   Patient lives at home with and great-granddaughter.    3 children, 1 deceased    Caffeine Use: 2-3 cups of coffee in the a.m. daily     PHYSICAL EXAM  Vitals:   11/27/17 1059  BP: 128/68  Pulse: 86  Weight: 120 lb 3.2 oz (54.5 kg)  Height: 5\' 4"  (1.626 m)   No data found.  Body mass index is 20.63 kg/m.  EXAM:  General: Patient is awake, alert and in no acute distress. Well developed and groomed. POSITIVE MYERSON'S. MASKED FACIES.  Neck: Neck is supple.  Cardiovascular: No carotid artery bruits. Heart is regular rate and rhythm with no murmurs.   Neurologic Exam  Mental Status: Awake, alert. Language is fluent and comprehension intact.  Cranial Nerves: Pupils are equal and reactive to light. Visual fields are full to confrontation. Conjugate eye movements are full and symmetric. Facial sensation and strength are symmetric. Hearing is intact. Palate elevated symmetrically and uvula is midline. Shoulder shrug is symmetric. Tongue is midline. SOFT, HOARSE VOICE.  Motor: RESTLESS, DYSKINESIAS IN UPPER AND LOWER EXT; COGWHEELING IN RUE > LUE. BRADYKINESIA IN BUE AND BLE. Full strength in the upper and lower extremities. No pronator drift.  Sensory: Intact and symmetric to light touch  Coordination: No ataxia or dysmetria on finger-nose or rapid alternating movement testing.  Gait and Station: STOOPED POSTURE. USING SINGLE POINT CANE; MODERATE UNSTEADY GAIT. SIGNIFICANT KYPHOSIS-SCOLIOSIS. POOR ARM SWING. SLOW, SHORT STEPS. Reflexes: Deep tendon reflexes in the upper and lower extremity are present and symmetric. ABSENT AT ANKLES.  DIAGNOSTIC DATA (LABS, IMAGING, TESTING) - I reviewed patient records, labs, notes, testing and imaging myself where available.  Lab Results  Component Value Date    WBC 5.6 12/16/2013   HGB 12.6 12/16/2013   HCT 38.9 12/16/2013   MCV 93.3 12/16/2013   PLT 224.0 12/16/2013      Component Value Date/Time   NA 141 04/30/2013 0820   K 4.0 04/30/2013 0820   CL 99 04/30/2013 0820   CO2 30 09/07/2008 1600   GLUCOSE 122* 04/30/2013 0820   BUN 18 04/30/2013 0820   CREATININE 0.70 04/30/2013 0820   CALCIUM 9.8 09/07/2008 1600   GFRNONAA >60 09/07/2008 1600   GFRAA  Value: >60   09/07/2008 1600    10/26/11 MRI brain - mild bitemporal atrophy    ASSESSMENT AND PLAN 84 y.o. female with diabetes, hypothyroidism, osteoporosis, with progressive right upper extremity resting tremor. She has cogwheel rigidity, bradykinesia, stooped posture, and Myerson's sign on exam. Continued progression of disease. Had orthostatic hypotension event / syncope in Jan 2017, due to rapidly standing. Has significant gait difficulty.    Dx: idiopathic Parkinson's disease   Parkinson's disease ([place])  Dizziness and giddiness  Gait difficulty   PLAN:   PARKINSON'S DISEASE (established problem, worsening)  - continue carbidopa-levodopa dosing (total 8-10 tabs per day) 7am - 3 tabs 11am - 2 tabs 3pm - 2 tabs 9pm: 1-2 tabs  - in future may include adding rasagiline, entacapone, neupro patch or changing carb/levo to rytary  - encouraged patient to use rollator walker at all times for fall prevention  Meds ordered this encounter  Medications  . carbidopa-levodopa (SINEMET IR) 25-100 MG tablet    Sig: Take 2-3 tablets by mouth 4 (four) times daily.    Dispense:  720  tablet    Refill:  4   Return in about 9 months (around 08/27/2018).    Penni Bombard, MD 04/11/44, 99:77 AM Certified in Neurology, Neurophysiology and Neuroimaging  [facility] [address], [place], [place] [address] [phone]

## 2017-11-27 NOTE — Patient Instructions (Signed)
  PARKINSON'S DISEASE  - continue carbidopa-levodopa dosing (total 8-10 tabs per day) 7am - 3 tabs 11am - 2 tabs 3pm - 2 tabs 9pm: 1-2 tabs  - in future may include adding rasagiline, entacapone, neupro patch or changing carb/levo to rytary  - please use rollator walker at all times for fall prevention

## 2018-02-06 ENCOUNTER — Emergency Department (HOSPITAL_COMMUNITY): Payer: Medicare Other

## 2018-02-06 ENCOUNTER — Encounter (HOSPITAL_COMMUNITY): Payer: Self-pay | Admitting: Emergency Medicine

## 2018-02-06 ENCOUNTER — Emergency Department (HOSPITAL_COMMUNITY)
Admission: EM | Admit: 2018-02-06 | Discharge: 2018-02-07 | Disposition: A | Discharge status: Home / Self Care | Payer: Medicare Other | Attending: Emergency Medicine | Admitting: Emergency Medicine

## 2018-02-06 DIAGNOSIS — E1140 Type 2 diabetes mellitus with diabetic neuropathy, unspecified: Secondary | ICD-10-CM | POA: Diagnosis not present

## 2018-02-06 DIAGNOSIS — T07XXXA Unspecified multiple injuries, initial encounter: Secondary | ICD-10-CM | POA: Insufficient documentation

## 2018-02-06 DIAGNOSIS — W19XXXA Unspecified fall, initial encounter: Secondary | ICD-10-CM

## 2018-02-06 DIAGNOSIS — Y998 Other external cause status: Secondary | ICD-10-CM | POA: Diagnosis not present

## 2018-02-06 DIAGNOSIS — M25561 Pain in right knee: Secondary | ICD-10-CM | POA: Diagnosis not present

## 2018-02-06 DIAGNOSIS — M79604 Pain in right leg: Secondary | ICD-10-CM | POA: Insufficient documentation

## 2018-02-06 DIAGNOSIS — S0101XA Laceration without foreign body of scalp, initial encounter: Secondary | ICD-10-CM | POA: Insufficient documentation

## 2018-02-06 DIAGNOSIS — Y9301 Activity, walking, marching and hiking: Secondary | ICD-10-CM | POA: Insufficient documentation

## 2018-02-06 DIAGNOSIS — M542 Cervicalgia: Secondary | ICD-10-CM | POA: Insufficient documentation

## 2018-02-06 DIAGNOSIS — S0990XA Unspecified injury of head, initial encounter: Secondary | ICD-10-CM | POA: Diagnosis present

## 2018-02-06 DIAGNOSIS — Z85828 Personal history of other malignant neoplasm of skin: Secondary | ICD-10-CM | POA: Diagnosis not present

## 2018-02-06 DIAGNOSIS — Y92219 Unspecified school as the place of occurrence of the external cause: Secondary | ICD-10-CM | POA: Insufficient documentation

## 2018-02-06 DIAGNOSIS — W108XXA Fall (on) (from) other stairs and steps, initial encounter: Secondary | ICD-10-CM | POA: Diagnosis not present

## 2018-02-06 DIAGNOSIS — Z79899 Other long term (current) drug therapy: Secondary | ICD-10-CM | POA: Diagnosis not present

## 2018-02-06 DIAGNOSIS — E039 Hypothyroidism, unspecified: Secondary | ICD-10-CM | POA: Diagnosis not present

## 2018-02-06 DIAGNOSIS — G20 Parkinson's disease: Secondary | ICD-10-CM | POA: Insufficient documentation

## 2018-02-06 DIAGNOSIS — M25521 Pain in right elbow: Secondary | ICD-10-CM | POA: Diagnosis not present

## 2018-02-06 LAB — BASIC METABOLIC PANEL
ANION GAP: 11 (ref 5–15)
BUN: 23 mg/dL — ABNORMAL HIGH (ref 6–20)
CHLORIDE: 98 mmol/L — AB (ref 101–111)
CO2: 25 mmol/L (ref 22–32)
Calcium: 9.6 mg/dL (ref 8.9–10.3)
Creatinine, Ser: 0.59 mg/dL (ref 0.44–1.00)
GFR calc non Af Amer: >60 mL/min (ref >60)
Glucose, Bld: 154 mg/dL — ABNORMAL HIGH (ref 65–99)
POTASSIUM: 3.8 mmol/L (ref 3.5–5.1)
Sodium: 134 mmol/L — ABNORMAL LOW (ref 135–145)

## 2018-02-06 LAB — CBC WITH DIFFERENTIAL/PLATELET
Basophils Absolute: 0 10*3/uL (ref 0.0–0.1)
Basophils Relative: 0 %
Eosinophils Absolute: 0.2 10*3/uL (ref 0.0–0.7)
Eosinophils Relative: 4 %
HEMATOCRIT: 37.9 % (ref 36.0–46.0)
HEMOGLOBIN: 12.2 g/dL (ref 12.0–15.0)
LYMPHS ABS: 1.3 10*3/uL (ref 0.7–4.0)
LYMPHS PCT: 22 %
MCH: 30.2 pg (ref 26.0–34.0)
MCHC: 32.2 g/dL (ref 30.0–36.0)
MCV: 93.8 fL (ref 78.0–100.0)
MONOS PCT: 5 %
Monocytes Absolute: 0.3 10*3/uL (ref 0.1–1.0)
NEUTROS ABS: 4 10*3/uL (ref 1.7–7.7)
NEUTROS PCT: 69 %
Platelets: 248 10*3/uL (ref 150–400)
RBC: 4.04 MIL/uL (ref 3.87–5.11)
RDW: 13 % (ref 11.5–15.5)
WBC: 5.8 10*3/uL (ref 4.0–10.5)

## 2018-02-06 MED ORDER — ONDANSETRON HCL 4 MG/2ML IJ SOLN
4.0000 mg | Freq: Once | INTRAMUSCULAR | Status: DC
  Filled 2018-02-06: qty 2

## 2018-02-06 MED ORDER — ONDANSETRON HCL 4 MG PO TABS
4.0000 mg | ORAL_TABLET | Freq: Once | ORAL | Status: AC
  Administered 2018-02-06: 4 mg via ORAL
  Filled 2018-02-06: qty 1

## 2018-02-06 MED ORDER — FENTANYL CITRATE (PF) 100 MCG/2ML IJ SOLN
50.0000 ug | Freq: Once | INTRAMUSCULAR | Status: DC
  Filled 2018-02-06: qty 2

## 2018-02-06 MED ORDER — BUPIVACAINE HCL 0.5 % IJ SOLN
10.0000 mL | Freq: Once | INTRAMUSCULAR | Status: DC
  Filled 2018-02-06 (×2): qty 10

## 2018-02-06 MED ORDER — FENTANYL CITRATE (PF) 100 MCG/2ML IJ SOLN
50.0000 ug | Freq: Once | INTRAMUSCULAR | Status: AC
  Administered 2018-02-06: 50 ug via INTRAMUSCULAR

## 2018-02-06 MED ORDER — TETANUS-DIPHTH-ACELL PERTUSSIS 5-2.5-18.5 LF-MCG/0.5 IM SUSP
0.5000 mL | Freq: Once | INTRAMUSCULAR | Status: AC
Start: 2018-02-06 — End: 2018-02-06
  Administered 2018-02-06: 0.5 mL via INTRAMUSCULAR
  Filled 2018-02-06: qty 0.5

## 2018-02-06 MED ORDER — CARBIDOPA-LEVODOPA ER 25-100 MG PO TBCR
2.0000 | EXTENDED_RELEASE_TABLET | Freq: Two times a day (BID) | ORAL | Status: DC
  Administered 2018-02-06: 2 via ORAL
  Filled 2018-02-06: qty 2

## 2018-02-06 NOTE — ED Notes (Signed)
Patient transported to X-ray 

## 2018-02-06 NOTE — Discharge Instructions (Addendum)
Make sure you keep your wounds clean and dry.  You can wash gently with soap and water.  Make sure before he put any bandages on them, they are nice and dry.  Follow-up with your primary care doctor next 2 to 4 days for further evaluation.  You can apply ice to the affected areas for pain and swelling relief.  Return to the emergency department for any worsening pain, headache, vision changes, numbness/weakness of your arms or legs, redness or swelling of your wounds, drainage from your wounds or any other worsening or concerning symptoms.

## 2018-02-06 NOTE — ED Provider Notes (Signed)
[facility] EMERGENCY DEPARTMENT Provider Note   CSN: [id number] Arrival date & time: 02/06/18  1537     History   Chief Complaint Chief Complaint  Patient presents with  . Fall    HPI Sarah Callahan is a 84 y.o. female past medical history of diabetes, osteoporosis, Parkinson's disease who presents for evaluation after a fall that occurred prior to arrival.  Patient does not recall the event.  Per EMS, patient fell down 4 steps.  Daughter who witnessed the fall states that patient was walking up some stairs at a school and had a spasm which caused her to lose her balance and fall down 4 stairs.  They did report positive LOC.  On EMS arrival, right lower extremity was shortened.  On ED arrival, patient is complaining of head pain, neck pain, right elbow pain, right knee and leg pain.  Patient has not been able to ambulate or bear weight since the incident.  Patient states she is not currently on any blood thinners.  She states that she is not having any vision abnormalities but reports difficulty opening the right eye.  Patient denies any chest pain, abdominal pain, nausea/vomiting, numbness/weakness of her arms or legs.  Patient is unsure when her last tetanus was.   The history is provided by the patient.    Past Medical History:  Diagnosis Date  . Arthritis   . Cancer (HCC)    forehead, basal cell  . Diabetes mellitus    diet controlled  . Dyspepsia   . GERD (gastroesophageal reflux disease)   . H/O: rheumatic fever   . Hypercalcemia   . Hypercholesteremia   . Hypothyroidism   . Macular degeneration   . OA (osteoarthritis of spine)    , Back  . Osteoporosis   . Osteoporosis   . Pancreatitis   . Parkinson disease ([place])   . Peripheral edema   . PONV (postoperative nausea and vomiting)   . Thoracic compression fracture ([place])   . Vitamin D deficiency   . Wears glasses     Patient Active Problem List   Diagnosis Date Noted  . Parkinson's disease ([place])  03/02/2014  . Dyspnea on exertion 12/16/2013  . Type 2 diabetes mellitus with peripheral neuropathy ([place]) 12/16/2013  . Hyperlipidemia 12/16/2013  . Varicose veins of lower extremities with other complications 40/98/1191  . Swelling of limb 02/05/2012    Past Surgical History:  Procedure Laterality Date  . ABDOMINAL HYSTERECTOMY    . APPENDECTOMY    . CARPAL TUNNEL RELEASE     both  . CATARACT EXTRACTION    . COLONOSCOPY    . EYE SURGERY     both cataracts  . FOOT SURGERY  2009   hammer toes  . HAND SURGERY    . HEMORRHOID SURGERY    . MOHS SURGERY Right 09/2015   forehead, basal cell  . TONSILLECTOMY    . TRIGGER FINGER RELEASE Right 04/30/2013   Procedure: RELEASE TRIGGER FINGER/A-1 PULLEY RIGHT RING FINGER;  Surgeon: Wynonia Sours, MD;  Location: [facility];  Service: Orthopedics;  Laterality: Right;     OB History   None      Home Medications    Prior to Admission medications   Medication Sig Start Date End Date Taking? Authorizing Provider  Bioflavonoid Products (VITAMIN C) CHEW Chew by mouth 2 (two) times daily.   Yes [provider]  carbidopa-levodopa (SINEMET IR) 25-100 MG tablet Take 2-3 tablets by mouth  4 (four) times daily. Patient taking differently: Take 2-3 tablets by mouth See admin instructions. Takes 3 tablets in the morning when she gets up 4 hours later she takes 3 tablets 2 tablets at 1600 1 before she goes to bed 11/27/17  Yes Penumalli, Earlean Polka, MD  Cholecalciferol (VITAMIN D-3) 5000 units TABS Take 1 tablet by mouth 2 (two) times daily.   Yes [provider]  Coenzyme Q10 (COQ-10 PO) Take 1 tablet by mouth daily.    Yes [provider]  denosumab (PROLIA) 60 MG/ML SOLN injection Inject 60 mg into the skin every 6 (six) months. Administer in upper arm, thigh, or abdomen   Yes [provider]  DHA-EPA-Vitamin E (OMEGA-3 COMPLEX PO) Take 1 capsule by mouth daily.   Yes [provider]    Flaxseed, Linseed, (FLAX SEED OIL PO) Take 1 tablet by mouth daily.   Yes [provider]  fluticasone (FLONASE) 50 MCG/ACT nasal spray Place 2 sprays into both nostrils as needed.  08/14/13  Yes [provider]  furosemide (LASIX) 20 MG tablet Take 1 tablet by mouth daily. 08/23/14  Yes [provider]  levothyroxine (SYNTHROID, LEVOTHROID) 75 MCG tablet Take 75 mcg by mouth daily.   Yes [provider]  Magnesium 250 MG TABS Take 250 mg by mouth daily.    Yes [provider]  Melatonin 3 MG TABS Take 3 mg by mouth at bedtime.   Yes [provider]  Multiple Vitamins-Minerals (HEALTHY EYES PO) Take 1 capsule by mouth 2 (two) times daily.   Yes [provider]  Multiple Vitamins-Minerals (MULTI-VITAMIN GUMMIES PO) Take 2 tablets by mouth daily. gummie   Yes [provider]  PARoxetine (PAXIL) 10 MG tablet Take 10 mg by mouth daily. One tablet daily    Yes [provider]  Thiamine HCl (VITAMIN B-1 PO) Take 1 tablet by mouth daily.   Yes [provider]  vitamin E (VITAMIN E) 200 UNIT capsule Take 400 Units by mouth daily.    Yes [provider]    Family History Family History  Problem Relation Age of Onset  . Heart disease Mother   . Heart disease Father   . Heart disease Brother   . Heart disease Maternal Grandmother   . Heart disease Maternal Grandfather   . Stroke Maternal Grandfather   . Heart disease Paternal Grandmother   . Heart disease Paternal Grandfather   . Heart attack Paternal Grandfather     Social History Social History   Tobacco Use  . Smoking status: Never Smoker  . Smokeless tobacco: Never Used  Substance Use Topics  . Alcohol use: No  . Drug use: No     Allergies   Cephalexin; Codeine; E-mycin [erythromycin base]; Elavil [amitriptyline]; Fosamax [alendronate]; Influenza virus vaccine split; Iodine; Lidocaine; Neurontin [gabapentin]; Novocain [procaine];  Penicillins; Pravastatin; Sulfa drugs cross reactors; Tramadol; Darvon [propoxyphene]; and Callahan   Review of Systems Review of Systems  Eyes: Negative for visual disturbance.  Respiratory: Negative for cough and shortness of breath.   Cardiovascular: Negative for chest pain.  Gastrointestinal: Negative for abdominal pain, nausea and vomiting.  Genitourinary: Negative for dysuria and hematuria.  Musculoskeletal:       Elbow pain Right leg and knee pain  Neurological: Positive for headaches.  All other systems reviewed and are negative.    Physical Exam Updated Vital Signs BP (!) 174/96   Pulse 90   Temp 98.3 F (36.8 C) (Oral)  Resp 16   SpO2 97%   Physical Exam  Constitutional: She is oriented to person, place, and time. She appears well-developed and well-nourished.  HENT:  Head: Normocephalic and atraumatic.    Mouth/Throat: Oropharynx is clear and moist and mucous membranes are normal.  Dried blood noted to the right parietal region.  There is an overlying hematoma with no skull deformity or crepitus noted.  Scattered abrasions with dried blood noted throughout.  Bruising, tenderness and ecchymosis noted to the right periorbital region.  No tenderness noted to the nasal bridge.  No septal hematoma.  Elevation/depression and lateral movement of mandible intact without any difficulty.  Eyes: Pupils are equal, round, and reactive to light. Conjunctivae, EOM and lids are normal.  Neck: Normal range of motion.  Limited ROM of neck. Tenderness noted  To the midline at approximately C4-C5 level. No deformity or crepitus noted.   Cardiovascular: Normal rate, regular rhythm, normal heart sounds and normal pulses. Exam reveals no gallop and no friction rub.  No murmur heard. Pulses:      Radial pulses are 2+ on the right side, and 2+ on the left side.       Dorsalis pedis pulses are 2+ on the right side, and 2+ on the left side.  Pulmonary/Chest: Effort normal and breath sounds  normal.  No evidence of respiratory distress. Able to speak in full sentences without difficulty.  Abdominal: Soft. Normal appearance. There is no tenderness. There is no rigidity and no guarding.  Musculoskeletal: Normal range of motion.  Tenderness palpation overlying the right shoulder with an overlying abrasion noted.  No deformity or crepitus noted.  Tenderness palpation noted to the right elbow.  Scattered wounds and abrasions noted to the posterior aspect of the elbow.  No deformity or crepitus noted.  Limited range of motion of right elbow secondary to pain.  Tenderness palpation overlying the proximal forearm/wrist.  No snuffbox tenderness.  No abnormalities of the left upper extremity.  Tenderness palpation diffusely over the right hip.  No deformity or crepitus noted. Tenderness palpation overlying the right knee with some overlying soft tissue swelling.  No deformity or crepitus noted.  Limited range of motion secondary to pain.  Neurological: She is alert and oriented to person, place, and time. GCS eye subscore is 4. GCS verbal subscore is 5. GCS motor subscore is 6.  Cranial nerves III-XII intact Follows commands, Moves all extremities  5/5 strength to BUE and BLE  Sensation intact throughout all major nerve distributions Normal finger to nose. No dysdiadochokinesia. No pronator drift. No gait abnormalities  No slurred speech. No facial droop.   Skin: Skin is warm and dry. Capillary refill takes less than 2 seconds.  Scattered abrasions noted to the right elbow. Scattered abrasions noted to the anterior right knee.   Psychiatric: She has a normal mood and affect. Her speech is normal.  Nursing note and vitals reviewed.    ED Treatments / Results  Labs (all labs ordered are listed, but only abnormal results are displayed) Labs Reviewed  BASIC METABOLIC PANEL - Abnormal; Notable for the following components:      Result Value   Sodium 134 (*)    Chloride 98 (*)    Glucose,  Bld 154 (*)    BUN 23 (*)    All other components within normal limits  CBC WITH DIFFERENTIAL/PLATELET    EKG EKG Interpretation  Date/Time:  Wednesday February 06 2018 19:07:23 EDT Ventricular Rate:  90 PR Interval:  QRS Duration: 78 QT Interval:  340 QTC Calculation: 416 R Axis:   56 Text Interpretation:  Normal sinus rhythm no acute ST/T changes rate is faster, otherwise similar to 2006 Confirmed by Sherwood Gambler [phone]) on 02/06/2018 8:36:45 PM   Radiology Dg Shoulder Right  Result Date: 02/06/2018 CLINICAL DATA:  Shoulder pain after fall. EXAM: RIGHT SHOULDER - 2+ VIEW COMPARISON:  None. FINDINGS: No acute fracture or dislocation. Mild acromioclavicular and glenohumeral osteoarthritis. Osteopenia. Soft tissues are unremarkable. IMPRESSION: 1.  No acute osseous abnormality. Electronically Signed   By: Titus Dubin M.D.   On: 02/06/2018 17:21   Dg Elbow Complete Right  Result Date: 02/06/2018 CLINICAL DATA:  Right elbow pain after fall. EXAM: RIGHT ELBOW - COMPLETE 3+ VIEW COMPARISON:  None. FINDINGS: There is no evidence of fracture, dislocation, or joint effusion. There is no evidence of arthropathy or other focal bone abnormality. Osteopenia. Triceps and common extensor enthesopathy. Soft tissues are unremarkable. IMPRESSION: Negative. Electronically Signed   By: Titus Dubin M.D.   On: 02/06/2018 17:23   Dg Wrist Complete Right  Result Date: 02/06/2018 CLINICAL DATA:  Wrist pain after fall. EXAM: RIGHT WRIST - COMPLETE 3+ VIEW COMPARISON:  None. FINDINGS: No acute fracture or dislocation. Moderate osteoarthritis of the scaphotrapeziotrapezoid and first CMC joints. Diffuse osteopenia. Chondrocalcinosis of the scapholunate ligament and TFCC, which can be age related or seen with CPPD arthropathy. IMPRESSION: 1.  No acute osseous abnormality. Electronically Signed   By: Titus Dubin M.D.   On: 02/06/2018 17:25   Ct Head Wo Contrast  Result Date: 02/06/2018 CLINICAL DATA:   [place] down 4 steps. Patient does not remember event. RIGHT eye and head swelling with laceration. Loss of consciousness. History of hypercholesterolemia, Parkinson's disease, diabetes and skin cancer. EXAM: CT HEAD WITHOUT CONTRAST CT ORBITS WITHOUT CONTRAST CT CERVICAL SPINE WITHOUT CONTRAST TECHNIQUE: Multidetector CT imaging of the head, cervical spine, and orbital structures were performed using the standard protocol without intravenous contrast. Multiplanar CT image reconstructions of the cervical spine and maxillofacial structures were also generated. COMPARISON:  CT HEAD Mar 17, 2017 FINDINGS: CT HEAD FINDINGS BRAIN: No intraparenchymal hemorrhage, mass effect nor midline shift. Moderate parenchymal brain volume loss. No hydrocephalus. Patchy supratentorial white matter hypodensities less than expected for patient's age, though non-specific are most compatible with chronic small vessel ischemic disease. No acute large vascular territory infarcts. Small volume dense RIGHT frontal subarachnoid hemorrhage. Basal cisterns are patent. VASCULAR: Mild calcific atherosclerosis of the carotid siphons. SKULL: No skull fracture.  Moderate RIGHT frontal scalp hematoma. OTHER: None. CT ORBITS FINDINGS ORBITS: Intact ocular globes. Status post bilateral ocular lens implants. Normal appearance of the optic nerve sheath complexes. Preservation of the orbital fat. Normal appearance of the extraocular muscles which are well located. Superior ophthalmic veins are not enlarged. SINUSES: Mild paranasal sinus mucosal thickening and small RIGHT maxillary sinus air-fluid level. OSSEOUS STRUCTURES/ SOFT TISSUES: RIGHT periorbital soft tissue swelling without subcutaneous gas or radiopaque foreign bodies. LEFT facial scar. No destructive bony lesions. CT CERVICAL SPINE FINDINGS ALIGNMENT: Straightened lordosis.  Vertebral bodies in alignment. SKULL BASE AND VERTEBRAE: Cervical vertebral bodies and posterior elements are intact.  Moderate to severe C5-6 disc height loss with endplate spurring compatible with degenerative disc. Multilevel disc desiccation. Moderate to severe RIGHT and moderate LEFT facet arthropathy. No destructive bony lesions. C1-2 articulation maintained, moderate arthropathy and longus coli insertional calcific tendinopathy. SOFT TISSUES AND SPINAL CANAL: Nonacute. Nuchal ligament calcifications. Moderate calcific atherosclerosis carotid bifurcations. Heterogeneous thyroid without  dominant nodule. DISC LEVELS: No significant osseous canal stenosis. Moderate to severe RIGHT C4-5, LEFT C5-6 neural foraminal narrowing. UPPER CHEST: Lung apices are clear. OTHER: None. IMPRESSION: CT HEAD: 1. Acute small volume RIGHT frontal subarachnoid hemorrhage. 2. Moderate RIGHT frontal scalp hematoma.  No skull fracture. 3. Stable moderate parenchymal brain volume loss and mild chronic small vessel ischemic disease. CT ORBITS: 1. RIGHT periorbital soft tissue swelling/contusion without postseptal involvement. CT CERVICAL SPINE: 1. No acute fracture or malalignment. 2. Moderate to severe C4-5 and C5-6 neural foraminal narrowing. Critical Value/emergent results were called by telephone at the time of interpretation on 02/06/2018 at 6:30 pm to Dr. Regenia Skeeter , who verbally acknowledged these results. Electronically Signed   By: Elon Alas M.D.   On: 02/06/2018 18:30   Ct Cervical Spine Wo Contrast  Result Date: 02/06/2018 CLINICAL DATA:  [place] down 4 steps. Patient does not remember event. RIGHT eye and head swelling with laceration. Loss of consciousness. History of hypercholesterolemia, Parkinson's disease, diabetes and skin cancer. EXAM: CT HEAD WITHOUT CONTRAST CT ORBITS WITHOUT CONTRAST CT CERVICAL SPINE WITHOUT CONTRAST TECHNIQUE: Multidetector CT imaging of the head, cervical spine, and orbital structures were performed using the standard protocol without intravenous contrast. Multiplanar CT image reconstructions of the cervical  spine and maxillofacial structures were also generated. COMPARISON:  CT HEAD Mar 17, 2017 FINDINGS: CT HEAD FINDINGS BRAIN: No intraparenchymal hemorrhage, mass effect nor midline shift. Moderate parenchymal brain volume loss. No hydrocephalus. Patchy supratentorial white matter hypodensities less than expected for patient's age, though non-specific are most compatible with chronic small vessel ischemic disease. No acute large vascular territory infarcts. Small volume dense RIGHT frontal subarachnoid hemorrhage. Basal cisterns are patent. VASCULAR: Mild calcific atherosclerosis of the carotid siphons. SKULL: No skull fracture.  Moderate RIGHT frontal scalp hematoma. OTHER: None. CT ORBITS FINDINGS ORBITS: Intact ocular globes. Status post bilateral ocular lens implants. Normal appearance of the optic nerve sheath complexes. Preservation of the orbital fat. Normal appearance of the extraocular muscles which are well located. Superior ophthalmic veins are not enlarged. SINUSES: Mild paranasal sinus mucosal thickening and small RIGHT maxillary sinus air-fluid level. OSSEOUS STRUCTURES/ SOFT TISSUES: RIGHT periorbital soft tissue swelling without subcutaneous gas or radiopaque foreign bodies. LEFT facial scar. No destructive bony lesions. CT CERVICAL SPINE FINDINGS ALIGNMENT: Straightened lordosis.  Vertebral bodies in alignment. SKULL BASE AND VERTEBRAE: Cervical vertebral bodies and posterior elements are intact. Moderate to severe C5-6 disc height loss with endplate spurring compatible with degenerative disc. Multilevel disc desiccation. Moderate to severe RIGHT and moderate LEFT facet arthropathy. No destructive bony lesions. C1-2 articulation maintained, moderate arthropathy and longus coli insertional calcific tendinopathy. SOFT TISSUES AND SPINAL CANAL: Nonacute. Nuchal ligament calcifications. Moderate calcific atherosclerosis carotid bifurcations. Heterogeneous thyroid without dominant nodule. DISC LEVELS: No  significant osseous canal stenosis. Moderate to severe RIGHT C4-5, LEFT C5-6 neural foraminal narrowing. UPPER CHEST: Lung apices are clear. OTHER: None. IMPRESSION: CT HEAD: 1. Acute small volume RIGHT frontal subarachnoid hemorrhage. 2. Moderate RIGHT frontal scalp hematoma.  No skull fracture. 3. Stable moderate parenchymal brain volume loss and mild chronic small vessel ischemic disease. CT ORBITS: 1. RIGHT periorbital soft tissue swelling/contusion without postseptal involvement. CT CERVICAL SPINE: 1. No acute fracture or malalignment. 2. Moderate to severe C4-5 and C5-6 neural foraminal narrowing. Critical Value/emergent results were called by telephone at the time of interpretation on 02/06/2018 at 6:30 pm to Dr. Regenia Skeeter , who verbally acknowledged these results. Electronically Signed   By: Thana Farr.D.  On: 02/06/2018 18:30   Dg Knee Complete 4 Views Right  Result Date: 02/06/2018 CLINICAL DATA:  [place] down 4 steps today.  Pain. EXAM: RIGHT KNEE - COMPLETE 4+ VIEW COMPARISON:  None available for comparison at time of study interpretation. FINDINGS: No fracture deformity or dislocation. Mild tricompartmental joint space narrowing periarticular sclerosis and marginal spurring. Faint intra-articular calcifications most compatible with CPPD. Mild medial knee soft tissue swelling, no subcutaneous gas or radiopaque foreign bodies. IMPRESSION: 1. No fracture deformity or dislocation. 2. Mild tricompartmental osteoarthrosis. Electronically Signed   By: Elon Alas M.D.   On: 02/06/2018 17:23   Dg Hip Unilat W Or Wo Pelvis 2-3 Views Right  Result Date: 02/06/2018 CLINICAL DATA:  [place] down 4 steps.  Pain. EXAM: DG HIP (WITH OR WITHOUT PELVIS) 2-3V RIGHT COMPARISON:  None. FINDINGS: There is no evidence of hip fracture or dislocation. There is no evidence of arthropathy or other focal bone abnormality. Faint periarticular calcifications. Mild vascular calcifications. IMPRESSION: No fracture  deformity or dislocation. Electronically Signed   By: Elon Alas M.D.   On: 02/06/2018 17:24   Dg Femur Min 2 Views Right  Result Date: 02/06/2018 CLINICAL DATA:  Shoulder pain. Fall down steps. Right leg shortening. EXAM: RIGHT FEMUR 2 VIEWS COMPARISON:  None. FINDINGS: There is no evidence of fracture or other focal bone lesions. Osteopenia. Soft tissue calcifications about the ischial tuberosity. IMPRESSION: No acute finding. Electronically Signed   By: Monte Fantasia M.D.   On: 02/06/2018 17:25   Ct Orbits Wo Contrast  Result Date: 02/06/2018 CLINICAL DATA:  [place] down 4 steps. Patient does not remember event. RIGHT eye and head swelling with laceration. Loss of consciousness. History of hypercholesterolemia, Parkinson's disease, diabetes and skin cancer. EXAM: CT HEAD WITHOUT CONTRAST CT ORBITS WITHOUT CONTRAST CT CERVICAL SPINE WITHOUT CONTRAST TECHNIQUE: Multidetector CT imaging of the head, cervical spine, and orbital structures were performed using the standard protocol without intravenous contrast. Multiplanar CT image reconstructions of the cervical spine and maxillofacial structures were also generated. COMPARISON:  CT HEAD Mar 17, 2017 FINDINGS: CT HEAD FINDINGS BRAIN: No intraparenchymal hemorrhage, mass effect nor midline shift. Moderate parenchymal brain volume loss. No hydrocephalus. Patchy supratentorial white matter hypodensities less than expected for patient's age, though non-specific are most compatible with chronic small vessel ischemic disease. No acute large vascular territory infarcts. Small volume dense RIGHT frontal subarachnoid hemorrhage. Basal cisterns are patent. VASCULAR: Mild calcific atherosclerosis of the carotid siphons. SKULL: No skull fracture.  Moderate RIGHT frontal scalp hematoma. OTHER: None. CT ORBITS FINDINGS ORBITS: Intact ocular globes. Status post bilateral ocular lens implants. Normal appearance of the optic nerve sheath complexes. Preservation of the  orbital fat. Normal appearance of the extraocular muscles which are well located. Superior ophthalmic veins are not enlarged. SINUSES: Mild paranasal sinus mucosal thickening and small RIGHT maxillary sinus air-fluid level. OSSEOUS STRUCTURES/ SOFT TISSUES: RIGHT periorbital soft tissue swelling without subcutaneous gas or radiopaque foreign bodies. LEFT facial scar. No destructive bony lesions. CT CERVICAL SPINE FINDINGS ALIGNMENT: Straightened lordosis.  Vertebral bodies in alignment. SKULL BASE AND VERTEBRAE: Cervical vertebral bodies and posterior elements are intact. Moderate to severe C5-6 disc height loss with endplate spurring compatible with degenerative disc. Multilevel disc desiccation. Moderate to severe RIGHT and moderate LEFT facet arthropathy. No destructive bony lesions. C1-2 articulation maintained, moderate arthropathy and longus coli insertional calcific tendinopathy. SOFT TISSUES AND SPINAL CANAL: Nonacute. Nuchal ligament calcifications. Moderate calcific atherosclerosis carotid bifurcations. Heterogeneous thyroid without dominant nodule. DISC LEVELS: No  significant osseous canal stenosis. Moderate to severe RIGHT C4-5, LEFT C5-6 neural foraminal narrowing. UPPER CHEST: Lung apices are clear. OTHER: None. IMPRESSION: CT HEAD: 1. Acute small volume RIGHT frontal subarachnoid hemorrhage. 2. Moderate RIGHT frontal scalp hematoma.  No skull fracture. 3. Stable moderate parenchymal brain volume loss and mild chronic small vessel ischemic disease. CT ORBITS: 1. RIGHT periorbital soft tissue swelling/contusion without postseptal involvement. CT CERVICAL SPINE: 1. No acute fracture or malalignment. 2. Moderate to severe C4-5 and C5-6 neural foraminal narrowing. Critical Value/emergent results were called by telephone at the time of interpretation on 02/06/2018 at 6:30 pm to Dr. Regenia Skeeter , who verbally acknowledged these results. Electronically Signed   By: Elon Alas M.D.   On: 02/06/2018 18:30     Procedures .Marland KitchenLaceration Repair Date/Time: 02/06/2018 9:07 PM Performed by: Volanda Napoleon, PA-C Authorized by: Volanda Napoleon, PA-C   Consent:    Consent obtained:  Verbal   Consent given by:  Patient   Risks discussed:  Infection, pain, poor cosmetic result and retained foreign body Anesthesia (see MAR for exact dosages):    Anesthesia method:  None Laceration details:    Location:  Scalp   Scalp location:  R temporal   Length (cm):  1 Repair type:    Repair type:  Simple Pre-procedure details:    Preparation:  Patient was prepped and draped in usual sterile fashion Treatment:    Area cleansed with:  Saline   Amount of cleaning:  Extensive   Irrigation solution:  Sterile saline   Irrigation method:  Syringe   Visualized foreign bodies/material removed: no   Skin repair:    Repair method:  Tissue adhesive Approximation:    Approximation:  Close Post-procedure details:    Dressing:  Open (no dressing)   (including critical care time)  Medications Ordered in ED Medications  Carbidopa-Levodopa ER (SINEMET CR) 25-100 MG tablet controlled release 2 tablet (2 tablets Oral Given 02/06/18 1854)  Tdap (BOOSTRIX) injection 0.5 mL (0.5 mLs Intramuscular Given 02/06/18 1857)  fentaNYL (SUBLIMAZE) injection 50 mcg (50 mcg Intramuscular Given 02/06/18 2018)  ondansetron (ZOFRAN) tablet 4 mg (4 mg Oral Given 02/06/18 2018)     Initial Impression / Assessment and Plan / ED Course  I have reviewed the triage vital signs and the nursing notes.  Pertinent labs & imaging results that were available during my care of the patient were reviewed by me and considered in my medical decision making (see chart for details).     84 year old female with past medical history of Parkinson's who presents for evaluation after a fall that occurred just prior to ED arrival. Patient initially states that she does not recall the incident.  Daughter at bedsideStates that she was walking up some stairs  at her granddaughter's elementary school and states that she had a Parkinson spasm, causing her to lose her balance and fall down 4 steps.  Patient did have positive LOC.  She is not on any blood thinners.  Patient reports that she had previously been on aspirin but states she is not taking that anymore.  On ED arrival, complains of a headache, neck pain, pain to right elbow, right lower extremity.  Has not been able to ambulate or bear weight since the incident.  Plan for CT head, CT orbits, CT C-spine.  We will plan x-ray right upper and lower extremity.  Will plan to check basic labs.  BMP shows slight bump in BUN but otherwise unremarkable.  CBC without any  significant leukocytosis, anemia.  CT head shows a small volume right frontal subarachnoid hemorrhage. Patient is not currently on any blood thinners.   There is also mention of a right frontal scalp hematoma.  No skull fracture.  CT ORBIT negative for any acute abnormality.  CT cervical spine shows negative for any acute fracture dislocation.  Knee x-ray negative for any acute fracture or dislocation.  X-ray of right femur shows no acute abnormalities.  Hip x-rays negative for any acute fracture dislocation.  Right wrist is negative for any acute fracture dislocation.  Right elbow and shoulder are negative for any acute fracture dislocation.  I discussed results with patient and family.  We will plan to consult neurosurgery for further evaluation.  Reevaluation after wound care.  Patient appears to have a very superficial small abrasion/laceration noted to the right temporal region.  We will plan to Dermabond.  Patient name able to ambulate and bear weight on bilateral lower extremities.  She was able to walk to the bathroom without any difficulty.  No indication for further CT imaging of pelvis for evaluation of occult hip fracture.  Family member came and told me that patient unsure if she passed out prior to onset of fall.  Patient had initially  denied any preceding chest pain or dizziness.  Daughter who had witnessed the fall states that patient had a spasm, causing her to lose her balance and fall. Will check EKG but doubt syncope given history.  Daughter who witnessed fall stated she was sure patient did not have  A syncopal episode.    Discussed with Sarah Callahan (Neurosurgery PA) after review of patient's imaging.  Given that patient is not currently on any blood thinners and that the bleed is very small, they recommend observing patient until 8 hours post fall. Patient will be observed until 11:30pm.  If patient is at baseline with no deficits, she can be discharged home.  She does not need any repeat CT imaging and does not need to follow-up in the office on an outpatient basis.  Small abrasion/laceration repaired with Dermabond as documented above.  Patient tolerated procedure well.  No complaints at this time.  Patient signed out to Eliezer Mccoy, PA-C with plan for re-evaluation at 11:30pm. Please see her note for further evaluation.     Final Clinical Impressions(s) / ED Diagnoses   Final diagnoses:  Fall, initial encounter  Injury of head, initial encounter  Laceration of scalp, initial encounter  Abrasions of multiple sites    ED Discharge Orders    None       Desma Mcgregor 02/07/18 Doreene Adas, MD 02/07/18 2308

## 2018-02-06 NOTE — ED Triage Notes (Signed)
Pt BIB [facility] after falling down 4 steps, +LOC, pt does not remember falling. Right swollen and bruised, EMS noted right leg shortening, bruising to the right elbow, laceration to the right side of head. Pt A&O x 3, cannot recall event. EMS vitals: BP 142/90, HR 100, RR 18, CBG 184

## 2018-02-06 NOTE — ED Notes (Signed)
Provided the patient with some applesauce and water

## 2018-02-07 NOTE — ED Provider Notes (Signed)
Signout from Simonne Martinet, PA-C at shift change See previous providers note for full H&P  Briefly, patient had a fall and sustained a small subarachnoid hemorrhage.  Patient had no neuro deficits on initial exam and has been cleared for neurosurgery.  She does not need follow-up as long as she continues to have no neuro deficits after observation, which ends at 2330.  All other imaging from patient's fall negative for acute findings.  On my reevaluation at 0000 patient's neuro exam is normal and without focal deficits.  Patient will be discharged home with follow-up to PCP.  Return precautions discussed.  Patient understands and agrees with plan.  Patient discharged in satisfactory condition with her granddaughter, with whom she lives.   Frederica Kuster, PA-C 02/07/18 6767    Sherwood Gambler, MD 02/07/18 [phone]

## 2018-02-15 ENCOUNTER — Ambulatory Visit
Admission: RE | Admit: 2018-02-15 | Discharge: 2018-02-15 | Disposition: A | Discharge status: Home / Self Care | Payer: Medicare Other | Source: Ambulatory Visit | Attending: Family Medicine | Admitting: Family Medicine

## 2018-02-15 ENCOUNTER — Telehealth: Payer: Self-pay | Admitting: Diagnostic Neuroimaging

## 2018-02-15 ENCOUNTER — Other Ambulatory Visit: Payer: Self-pay | Admitting: Family Medicine

## 2018-02-15 DIAGNOSIS — W19XXXA Unspecified fall, initial encounter: Secondary | ICD-10-CM

## 2018-02-15 DIAGNOSIS — Z9181 History of falling: Secondary | ICD-10-CM

## 2018-02-15 DIAGNOSIS — I609 Nontraumatic subarachnoid hemorrhage, unspecified: Secondary | ICD-10-CM

## 2018-02-15 NOTE — Telephone Encounter (Signed)
Pt's granddaughter called states on 4/3 the pt fell, she went to ED and has a brain bleed. She states PCP is wanting her to be eval due to the brain bleed, she has fallen twice since 4/3 and is starting to drag her rt foot.  An appt has been scheduled on 4/16 at 8:30.

## 2018-02-15 NOTE — Telephone Encounter (Signed)
Agree with plan. --VRP 

## 2018-02-19 ENCOUNTER — Ambulatory Visit: Payer: Medicare Other | Admitting: Diagnostic Neuroimaging

## 2018-02-19 ENCOUNTER — Encounter: Payer: Self-pay | Admitting: Diagnostic Neuroimaging

## 2018-02-19 VITALS — BP 115/58 | HR 80 | Ht 64.0 in | Wt 117.0 lb

## 2018-02-19 DIAGNOSIS — R269 Unspecified abnormalities of gait and mobility: Secondary | ICD-10-CM | POA: Diagnosis not present

## 2018-02-19 DIAGNOSIS — R42 Dizziness and giddiness: Secondary | ICD-10-CM

## 2018-02-19 DIAGNOSIS — G20 Parkinson's disease: Secondary | ICD-10-CM | POA: Diagnosis not present

## 2018-02-19 MED ORDER — RASAGILINE MESYLATE 0.5 MG PO TABS: 0.5000 mg | ORAL_TABLET | Freq: Every day | ORAL | 12 refills | Status: DC

## 2018-02-19 MED ORDER — CARBIDOPA-LEVODOPA 25-100 MG PO TABS: 2.0000 | ORAL_TABLET | Freq: Four times a day (QID) | ORAL | 4 refills | Status: DC

## 2018-02-19 NOTE — Patient Instructions (Signed)
PARKINSON'S DISEASE (established problem, worsening)  - continue carbidopa-levodopa dosing (total 8-10 tabs per day) 8am - 3 tabs noon - 3 tabs 4pm - 2 tabs 8pm: 1 tabs  - add rasagiline 0.5mg  daily  - home health agency referral --> - use walker at all times for fall prevention; will give rx for ustep walker, lightweight wheelchair, and lift chair

## 2018-02-19 NOTE — Progress Notes (Signed)
PATIENT: Sarah Callahan DOB: 12/30/1930   REASON FOR VISIT: follow up for PD HISTORY FROM: patient and granddaughter  Chief Complaint  Patient presents with  . Parkinson's disease    rm 9, granddgtr- Tonya, seen in ED s/p fall- had CT scans, 2 falls since then, dragging right foot, confused; had repeat CT scan Friday"     HISTORY OF PRESENT ILLNESS:  UPDATE (02/19/18, VRP): Since last visit, doing poorly; multiple falls (4/3, 4/8, 4/12). Went to ER. Had small right frontal SAH on CT head. Followup CT stable. Tolerating CARB/LEVO. No alleviating or aggravating factors.   UPDATE (11/27/17, VRP): Since last visit, doing well. Tolerating meds. Now on carb/levo 25/100 (9 tabs divided over 4 times per day). No alleviating or aggravating factors. Has had more falls since last visit, but these occurred when she was note using cane or walker, and she was "doing things I am not supposed to do" such as cleaning up in the basement, bending and lifting items etc. When she uses cane and rollator, she does well. Now grand-daughter [name] and her friends (a married couple) have moved in with patient and great-grand daughter, and this arrangement is helping a lot.   UPDATE 05/23/17: Since last visit, had a difficult May 2018; had multiple falls on to the cement and on to the coffee table. Was not using rollator at that time. More stress in Apr and May 2018. Went to ER, and CT head negative. Now feeling better. Now using the rollator more and not having any falls. Tolerating carb/levo. No wearing off.  UPDATE 11/21/16: Since last visit, pt is stable with PD. She has had some falls, but no major injuries. Using a cane and her scooter. Sometimes uses walker. Also unfortunately pt's husband passed away in Nov 26, 2016 from heart attack (age 37 yrs old). Pt living at home with grand-daughter and son-in-law. More fatigue in early AM. Some recent positional vertigo when getting up in the AM. No nausea or vomiting.    UPDATE 05/15/16: Since last visit, doing fair; some wearing off after 7:30am dose --> by 10:30-11 feels wearing off. Afternoon is better. Sleep and gait otherwise stable.   UPDATE 11/17/15: Since last visit, doing well on carb/levo 1.5 tabs QID. Overall doing well. 2-3 wks ago, stood up quickly, then fell backwards and thinks she passed out. Her family was there and caught her from falling to ground. She regained consciousness quickly. No major injuries or post ictal confusion.   UPDATE 12/07/14 (VRP): Since last visit, she feel stable from PD standpoint. No wearing off. Some nightmares and restless legs at night. Still with stress related to family issues. Now only husband and great-grand-daughter are at home.   UPDATE 09/04/14 (VRP): Since last visit, feels better. Taking carb/levo (1.5/1.5/1/1) and doing better. Still with tremor, slowness, stiffness. Still with balance diff. No falls. Uses a walker at times. Her husband had a "light stroke" in July 2015, with good recovery. He does most of driving. Patient still drives a little (daytime). Patient's grand-daughter (and her boyfriend) and great-grand-daughter live with them. Patient has daughter and another granddaughter who could help her in the future if her health declines.   UPDATE 03/02/14 (VRP): Since last visit, she notes that her PD symptoms are more significant in AM (with dose 1 and 2) but sig improve with doses 3 and 4. Now having intermittent dystonic posturing of left foot (previously only in right foot). Some restless legs at night. No falls. Uses a  scooter or shopping cart for longer distances. Able to get around at home holding on to walls etc.   UPDATE 10/10/13 (LL):  Since last visit, she started increasing Sinemet from TID to QID, with good results.  Sometimes she is so busy she forgets to take extra dose.  Dystonia is the same in right foot, but she would rather hold off on starting another medication until she has to.  She has custody  of her great-granddaughter who is 7 and has ADHD.  UPDATE 07/10/13 (VP): Since last visit, patient is noted some wearing off between dosages. She also is noted dystonia in the right foot. This has been happening over the past 2-3 months. She also has had increasing falls and balance problems. Patient is alone for this visit. She is not using a walker or cane.   UPDATE 05/29/12: Doing well. Taking carb/levo TID. No wearing off. No dyskinesias. She is happy with current dosing. PT has helped. Uses a cane for distance walking.   UPDATE 12/22/11: Doing better on carb/levo. notices tremor is worse if she misses a dose. Still with balance diff, and has fallen down. Also found that ther gas fireplace was leaking carbon monoide into the home. Now replaced, and her fatigue is better.   PRIOR HPI: 84 year old right-handed female with history of diabetes, hypothyroidism, osteoporosis, here for evaluation of tremor. patient reports progressive right upper extremity tremor for the past 1-1/2 years. Tremor is mainly present at rest but sometimes when she holds a cup or utensils. Tremor worsens with stress. Patient also reports unsteady gait and balance, intermittent drooling, and constipation. No significant change in sense of taste or smell. No reports of sleep disturbance or hallucination. No family history of Parkinson's disease.    REVIEW OF SYSTEMS: Full 14 system review of systems performed and negative except: cramps walking dizziness tremors depression constipation.   ALLERGIES: Allergies  Allergen Reactions  . Cephalexin Other (See Comments)    Gets UTI after taking  . Codeine Itching  . Crestor [Rosuvastatin Calcium] Other (See Comments)    Tired, confused, GI issues  . E-Mycin [Erythromycin Base] Nausea And Vomiting  . Elavil [Amitriptyline] Other (See Comments)  . Fosamax [Alendronate] Other (See Comments)    Body aches  . Influenza Virus Vaccine Split Other (See Comments)    Side effects  .  Iodine Other (See Comments)    infection  . Lidocaine Other (See Comments)    Feels hot, turns red  . Neurontin [Gabapentin] Other (See Comments)    Depression  . Novocain [Procaine] Other (See Comments)  . Penicillins Hives  . Pravastatin Other (See Comments)    Bone and joint pain  . Sulfa Drugs Cross Reactors Hives  . Tramadol Other (See Comments)    Muscle pain  . Darvon [Propoxyphene] Rash  . Mercury Rash    HOME MEDICATIONS: Outpatient Medications Prior to Visit  Medication Sig Dispense Refill  . Bioflavonoid Products (VITAMIN C) CHEW Chew by mouth 2 (two) times daily.    . carbidopa-levodopa (SINEMET IR) 25-100 MG tablet Take 2-3 tablets by mouth 4 (four) times daily. (Patient taking differently: Take 2-3 tablets by mouth See admin instructions. Takes 3 tablets in the morning when she gets up 4 hours later she takes 3 tablets 2 tablets at 1600 1 before she goes to bed) 720 tablet 4  . Cholecalciferol (VITAMIN D-3) 5000 units TABS Take 1 tablet by mouth 2 (two) times daily.    . Coenzyme Q10 (COQ-10 PO)  Take 1 tablet by mouth daily.     Marland Kitchen denosumab (PROLIA) 60 MG/ML SOLN injection Inject 60 mg into the skin every 6 (six) months. Administer in upper arm, thigh, or abdomen    . DHA-EPA-Vitamin E (OMEGA-3 COMPLEX PO) Take 1 capsule by mouth daily.    . Flaxseed, Linseed, (FLAX SEED OIL PO) Take 1 tablet by mouth daily.    . fluticasone (FLONASE) 50 MCG/ACT nasal spray Place 2 sprays into both nostrils as needed.     . furosemide (LASIX) 20 MG tablet Take 1 tablet by mouth daily.    Marland Kitchen levothyroxine (SYNTHROID, LEVOTHROID) 75 MCG tablet Take 75 mcg by mouth daily.    . Magnesium 250 MG TABS Take 250 mg by mouth daily.     . Melatonin 3 MG TABS Take 3 mg by mouth at bedtime.    . Multiple Vitamins-Minerals (HEALTHY EYES PO) Take 1 capsule by mouth 2 (two) times daily.    . Multiple Vitamins-Minerals (MULTI-VITAMIN GUMMIES PO) Take 2 tablets by mouth daily. gummie    . PARoxetine  (PAXIL) 10 MG tablet Take 10 mg by mouth daily. One tablet daily     . Thiamine HCl (VITAMIN B-1 PO) Take 1 tablet by mouth daily.    . vitamin E (VITAMIN E) 200 UNIT capsule Take 400 Units by mouth daily.      No facility-administered medications prior to visit.     PAST MEDICAL HISTORY: Past Medical History:  Diagnosis Date  . Arthritis   . Cancer (HCC)    forehead, basal cell  . Diabetes mellitus    diet controlled  . Dyspepsia   . Falls   . GERD (gastroesophageal reflux disease)   . H/O: rheumatic fever   . Hypercalcemia   . Hypercholesteremia   . Hypothyroidism   . Macular degeneration   . OA (osteoarthritis of spine)    , Back  . Osteoporosis   . Osteoporosis   . Pancreatitis   . Parkinson disease ([place])   . Peripheral edema   . PONV (postoperative nausea and vomiting)   . Thoracic compression fracture ([place])   . Vitamin D deficiency   . Wears glasses     PAST SURGICAL HISTORY: Past Surgical History:  Procedure Laterality Date  . ABDOMINAL HYSTERECTOMY    . APPENDECTOMY    . CARPAL TUNNEL RELEASE     both  . CATARACT EXTRACTION    . COLONOSCOPY    . EYE SURGERY     both cataracts  . FOOT SURGERY  2009   hammer toes  . HAND SURGERY    . HEMORRHOID SURGERY    . MOHS SURGERY Right 09/2015   forehead, basal cell  . TONSILLECTOMY    . TRIGGER FINGER RELEASE Right 04/30/2013   Procedure: RELEASE TRIGGER FINGER/A-1 PULLEY RIGHT RING FINGER;  Surgeon: Wynonia Sours, MD;  Location: [facility];  Service: Orthopedics;  Laterality: Right;    FAMILY HISTORY: Family History  Problem Relation Age of Onset  . Heart disease Mother   . Heart disease Father   . Heart disease Brother   . Heart disease Maternal Grandmother   . Heart disease Maternal Grandfather   . Stroke Maternal Grandfather   . Heart disease Paternal Grandmother   . Heart disease Paternal Grandfather   . Heart attack Paternal Grandfather     SOCIAL HISTORY: Social History    Socioeconomic History  . Marital status: Married    Spouse name: Barnabas Lister  .  Number of children: 3  . Years of education: College  . Highest education level: Not on file  Occupational History  . Occupation: retired    Comment: n/a  Social Needs  . Financial resource strain: Not on file  . Food insecurity:    Worry: Not on file    Inability: Not on file  . Transportation needs:    Medical: Not on file    Non-medical: Not on file  Tobacco Use  . Smoking status: Never Smoker  . Smokeless tobacco: Never Used  Substance and Sexual Activity  . Alcohol use: No  . Drug use: No  . Sexual activity: Not on file  Lifestyle  . Physical activity:    Days per week: Not on file    Minutes per session: Not on file  . Stress: Not on file  Relationships  . Social connections:    Talks on phone: Not on file    Gets together: Not on file    Attends religious service: Not on file    Active member of club or organization: Not on file    Attends meetings of clubs or organizations: Not on file    Relationship status: Not on file  . Intimate partner violence:    Fear of current or ex partner: Not on file    Emotionally abused: Not on file    Physically abused: Not on file    Forced sexual activity: Not on file  Other Topics Concern  . Not on file  Social History Narrative   Patient lives at home with and great-granddaughter.    3 children, 1 deceased    Caffeine Use: 2-3 cups of coffee in the a.m. daily     PHYSICAL EXAM  Vitals:   02/19/18 0824  BP: (!) 115/58  Pulse: 80  Weight: 117 lb (53.1 kg)  Height: 5\' 4"  (1.626 m)   No data found.  Body mass index is 20.08 kg/m.  EXAM:  General: Patient is awake, alert and in no acute distress. Well developed and groomed. POSITIVE MYERSON'S. MASKED FACIES. RIGHT PERIORBITAL ECCHYMOSES Neck: Neck is supple.  Cardiovascular: No carotid artery bruits. Heart is regular rate and rhythm with no murmurs.   Neurologic Exam  Mental Status:  Awake, alert. Language is fluent and comprehension intact.  Cranial Nerves: Pupils are equal and reactive to light. Visual fields are full to confrontation. Conjugate eye movements are full and symmetric. Facial sensation and strength are symmetric. Hearing is intact. Palate elevated symmetrically and uvula is midline. Shoulder shrug is symmetric. Tongue is midline. SOFT, HOARSE VOICE.  Motor: MILD RESTLESS, DYSKINESIAS IN UPPER AND LOWER EXT; COGWHEELING IN RUE > LUE. BRADYKINESIA IN BUE AND BLE. Full strength in the upper and lower extremities. No pronator drift.  Sensory: Intact and symmetric to light touch  Coordination: No ataxia or dysmetria on finger-nose or rapid alternating movement testing.  Gait and Station: STOOPED POSTURE. USING SINGLE POINT CANE; MODERATE UNSTEADY GAIT. SIGNIFICANT KYPHOSIS-SCOLIOSIS. POOR ARM SWING. SLOW, SHORT STEPS. Reflexes: Deep tendon reflexes in the upper and lower extremity are present and symmetric. ABSENT AT ANKLES.  DIAGNOSTIC DATA (LABS, IMAGING, TESTING) - I reviewed patient records, labs, notes, testing and imaging myself where available.  Lab Results  Component Value Date   WBC 5.8 02/06/2018   HGB 12.2 02/06/2018   HCT 37.9 02/06/2018   MCV 93.8 02/06/2018   PLT 248 02/06/2018      Component Value Date/Time   NA 141 04/30/2013 0820  K 4.0 04/30/2013 0820   CL 99 04/30/2013 0820   CO2 30 09/07/2008 1600   GLUCOSE 122* 04/30/2013 0820   BUN 18 04/30/2013 0820   CREATININE 0.70 04/30/2013 0820   CALCIUM 9.8 09/07/2008 1600   GFRNONAA >60 09/07/2008 1600   GFRAA  Value: >60   09/07/2008 1600    10/26/11 MRI brain - mild bitemporal atrophy   02/06/18 CT HEAD: 1. Acute small volume RIGHT frontal subarachnoid hemorrhage. 2. Moderate RIGHT frontal scalp hematoma.  No skull fracture. 3. Stable moderate parenchymal brain volume loss and mild chronic small vessel ischemic disease.  02/06/18 CT ORBITS: 1. RIGHT periorbital soft tissue  swelling/contusion without postseptal involvement.  02/06/18 CT CERVICAL SPINE:  1. No acute fracture or malalignment. 2. Moderate to severe C4-5 and C5-6 neural foraminal narrowing.  02/15/18 CT head  - Small volume subarachnoid hemorrhage over the RIGHT frontal convexity is improved compared with 02/06/2018, but still visible. No new areas of hemorrhage are observed. - Stable atrophy and small vessel disease.  No skull fracture.   ASSESSMENT AND PLAN 84 y.o. female with diabetes, hypothyroidism, osteoporosis, with progressive right upper extremity resting tremor. She has cogwheel rigidity, bradykinesia, stooped posture, and Myerson's sign on exam. Continued progression of disease. Had orthostatic hypotension event / syncope in Jan 2017, due to rapidly standing. Has significant gait difficulty.    Dx: idiopathic Parkinson's disease   Parkinson's disease ([place])  Dizziness and giddiness  Gait difficulty   PLAN:   I spent 40 minutes of face to face time with patient. Greater than 50% of time was spent in counseling and coordination of care with patient. In summary we discussed:   PARKINSON'S DISEASE (established problem, worsening)  - continue carbidopa-levodopa dosing (total 8-10 tabs per day) 8am - 3 tabs noon - 3 tabs 4pm - 2 tabs 8pm: 1 tabs  - add rasagiline 0.5mg  daily  - home health agency referral --> - use walker at all times for fall prevention; will give rx for ustep walker, lightweight wheelchair, and lift chair   Orders Placed This Encounter  Procedures  . DME Other see comment    Lift chair; parkinson's disease; Parkinson's disease ([place]) [G20]  . For home use only DME lightweight manual wheelchair with seat cushion    Patient suffers from parkinson's disease Parkinson's disease ([place]) [G20]: which impairs their ability to perform daily activities like bathing, dressing and grooming in the home.  A cane or walker will not resolve  issue with performing  activities of daily living. A wheelchair will allow patient to safely perform daily activities. Patient is not able to propel themselves in the home using a standard weight wheelchair due to arm weakness and general weakness. Patient can self propel in the lightweight wheelchair.  Accessories: elevating leg rests (ELRs), wheel locks, extensions and anti-tippers.  . DME Other see comment    ustep walker; parkinson's disease; Parkinson's disease ([place]) [G20]  . Ambulatory referral to Home Health    Referral Priority:   Routine    Referral Type:   Home Health Care    Referral Reason:   Specialty Services Required    Requested Specialty:   [place]    Number of Visits Requested:   1   Meds ordered this encounter  Medications  . carbidopa-levodopa (SINEMET IR) 25-100 MG tablet    Sig: Take 2-3 tablets by mouth 4 (four) times daily.    Dispense:  720 tablet    Refill:  4  .  rasagiline (AZILECT) 0.5 MG TABS tablet    Sig: Take 1 tablet (0.5 mg total) by mouth daily.    Dispense:  30 tablet    Refill:  12   Return in about 6 months (around 08/21/2018).    Penni Bombard, MD 2/58/9483, 4:75 AM Certified in Neurology, Neurophysiology and Neuroimaging  [facility] [address], [place], [place] [address] [phone]

## 2018-02-28 ENCOUNTER — Telehealth: Payer: Self-pay | Admitting: Diagnostic Neuroimaging

## 2018-02-28 NOTE — Telephone Encounter (Signed)
Spoke with Merry Proud PT/ [facility] and gave verbal order for PT twice a week x 2 weeks, then once weekly x 1 week. He verbalized understanding, appreciation.

## 2018-02-28 NOTE — Telephone Encounter (Signed)
Jeff/[facility] [phone] request home health PT 2 x 2, then 1 x 1. Please call to advise

## 2018-03-25 NOTE — Telephone Encounter (Signed)
Fax confirmation received for [facility] and POC. PT (future signatures to be sent to pcp per Dr. Leta Baptist).

## 2018-04-09 ENCOUNTER — Encounter: Payer: Self-pay | Admitting: Emergency Medicine

## 2018-04-09 ENCOUNTER — Emergency Department
Admission: EM | Admit: 2018-04-09 | Discharge: 2018-04-09 | Disposition: A | Discharge status: Home / Self Care | Payer: Medicare Other | Attending: Emergency Medicine | Admitting: Emergency Medicine

## 2018-04-09 ENCOUNTER — Emergency Department: Payer: Medicare Other

## 2018-04-09 DIAGNOSIS — Y92000 Kitchen of unspecified non-institutional (private) residence as  the place of occurrence of the external cause: Secondary | ICD-10-CM | POA: Diagnosis not present

## 2018-04-09 DIAGNOSIS — Y9389 Activity, other specified: Secondary | ICD-10-CM | POA: Diagnosis not present

## 2018-04-09 DIAGNOSIS — E119 Type 2 diabetes mellitus without complications: Secondary | ICD-10-CM | POA: Diagnosis not present

## 2018-04-09 DIAGNOSIS — W07XXXA Fall from chair, initial encounter: Secondary | ICD-10-CM | POA: Diagnosis not present

## 2018-04-09 DIAGNOSIS — Z79899 Other long term (current) drug therapy: Secondary | ICD-10-CM | POA: Insufficient documentation

## 2018-04-09 DIAGNOSIS — G20 Parkinson's disease: Secondary | ICD-10-CM | POA: Diagnosis not present

## 2018-04-09 DIAGNOSIS — E039 Hypothyroidism, unspecified: Secondary | ICD-10-CM | POA: Insufficient documentation

## 2018-04-09 DIAGNOSIS — S2231XA Fracture of one rib, right side, initial encounter for closed fracture: Secondary | ICD-10-CM | POA: Insufficient documentation

## 2018-04-09 DIAGNOSIS — S20211A Contusion of right front wall of thorax, initial encounter: Secondary | ICD-10-CM | POA: Diagnosis not present

## 2018-04-09 DIAGNOSIS — Y999 Unspecified external cause status: Secondary | ICD-10-CM | POA: Diagnosis not present

## 2018-04-09 DIAGNOSIS — S299XXA Unspecified injury of thorax, initial encounter: Secondary | ICD-10-CM | POA: Diagnosis present

## 2018-04-09 DIAGNOSIS — Z85828 Personal history of other malignant neoplasm of skin: Secondary | ICD-10-CM | POA: Insufficient documentation

## 2018-04-09 MED ORDER — OXYCODONE HCL 5 MG PO TABS: 2.5000 mg | ORAL_TABLET | Freq: Four times a day (QID) | ORAL | 0 refills | Status: DC | PRN

## 2018-04-09 MED ORDER — ACETAMINOPHEN 325 MG PO TABS
650.0000 mg | ORAL_TABLET | Freq: Once | ORAL | Status: AC
  Administered 2018-04-09: 650 mg via ORAL
  Filled 2018-04-09: qty 2

## 2018-04-09 MED ORDER — OXYCODONE-ACETAMINOPHEN 5-325 MG PO TABS
0.5000 | ORAL_TABLET | Freq: Once | ORAL | Status: AC
  Administered 2018-04-09: 0.5 via ORAL
  Filled 2018-04-09: qty 1

## 2018-04-09 NOTE — ED Notes (Addendum)
Patient very uncomfortable, moaning in pain from wheelchair to recliner.  Discussed this with xray technician.  Xray staff felt it would be best for patient to wait to perform xray until she is in a stretcher in order to minimize her pain and discomfort.  First Nurse notified.

## 2018-04-09 NOTE — ED Provider Notes (Signed)
[facility] Emergency Department Provider Note  ____________________________________________  Time seen: Approximately 8:58 PM  I have reviewed the triage vital signs and the nursing notes.   HISTORY  Chief Complaint Chest Pain and Fall    HPI Sarah Callahan is a 84 y.o. female with a history of GERD and diabetes and osteoporosis who complains of right chest wall pain after a fall today. The patient was sitting in a chair in the kitchen when she fell off the chair into the trashcan. She hit the right lateral chest wall and had sudden severe pain in that area immediately afterward. Pain is constant, nonradiating, worse with deep breathing, no alleviating factors. Feels sharp. Denies shortness of breath      Past Medical History:  Diagnosis Date  . Arthritis   . Cancer (HCC)    forehead, basal cell  . Diabetes mellitus    diet controlled  . Dyspepsia   . Falls   . GERD (gastroesophageal reflux disease)   . H/O: rheumatic fever   . Hypercalcemia   . Hypercholesteremia   . Hypothyroidism   . Macular degeneration   . OA (osteoarthritis of spine)    , Back  . Osteoporosis   . Osteoporosis   . Pancreatitis   . Parkinson disease ([place])   . Peripheral edema   . PONV (postoperative nausea and vomiting)   . Thoracic compression fracture ([place])   . Vitamin D deficiency   . Wears glasses      Patient Active Problem List   Diagnosis Date Noted  . Parkinson's disease ([place]) 03/02/2014  . Dyspnea on exertion 12/16/2013  . Type 2 diabetes mellitus with peripheral neuropathy ([place]) 12/16/2013  . Hyperlipidemia 12/16/2013  . Varicose veins of lower extremities with other complications 55/73/2202  . Swelling of limb 02/05/2012     Past Surgical History:  Procedure Laterality Date  . ABDOMINAL HYSTERECTOMY    . APPENDECTOMY    . CARPAL TUNNEL RELEASE     both  . CATARACT EXTRACTION    . COLONOSCOPY    . EYE SURGERY     both cataracts  . FOOT  SURGERY  2009   hammer toes  . HAND SURGERY    . HEMORRHOID SURGERY    . MOHS SURGERY Right 09/2015   forehead, basal cell  . TONSILLECTOMY    . TRIGGER FINGER RELEASE Right 04/30/2013   Procedure: RELEASE TRIGGER FINGER/A-1 PULLEY RIGHT RING FINGER;  Surgeon: Wynonia Sours, MD;  Location: [facility];  Service: Orthopedics;  Laterality: Right;     Prior to Admission medications   Medication Sig Start Date End Date Taking? Authorizing Provider  Bioflavonoid Products (VITAMIN C) CHEW Chew by mouth 2 (two) times daily.    [provider]  carbidopa-levodopa (SINEMET IR) 25-100 MG tablet Take 2-3 tablets by mouth 4 (four) times daily. 02/19/18   Penumalli, Earlean Polka, MD  Cholecalciferol (VITAMIN D-3) 5000 units TABS Take 1 tablet by mouth 2 (two) times daily.    [provider]  Coenzyme Q10 (COQ-10 PO) Take 1 tablet by mouth daily.     [provider]  denosumab (PROLIA) 60 MG/ML SOLN injection Inject 60 mg into the skin every 6 (six) months. Administer in upper arm, thigh, or abdomen    [provider]  DHA-EPA-Vitamin E (OMEGA-3 COMPLEX PO) Take 1 capsule by mouth daily.    [provider]  Flaxseed, Linseed, (FLAX SEED OIL PO) Take 1 tablet by mouth daily.  [provider]  fluticasone (FLONASE) 50 MCG/ACT nasal spray Place 2 sprays into both nostrils as needed.  08/14/13   [provider]  furosemide (LASIX) 20 MG tablet Take 1 tablet by mouth daily. 08/23/14   [provider]  levothyroxine (SYNTHROID, LEVOTHROID) 75 MCG tablet Take 75 mcg by mouth daily.    [provider]  Magnesium 250 MG TABS Take 250 mg by mouth daily.     [provider]  Melatonin 3 MG TABS Take 3 mg by mouth at bedtime.    [provider]  Multiple Vitamins-Minerals (HEALTHY EYES PO) Take 1 capsule by mouth 2 (two) times daily.    [provider]  Multiple Vitamins-Minerals (MULTI-VITAMIN  GUMMIES PO) Take 2 tablets by mouth daily. gummie    [provider]  oxyCODONE (ROXICODONE) 5 MG immediate release tablet Take 0.5 tablets (2.5 mg total) by mouth every 6 (six) hours as needed for breakthrough pain. 04/09/18   Carrie Mew, MD  PARoxetine (PAXIL) 10 MG tablet Take 10 mg by mouth daily. One tablet daily     [provider]  rasagiline (AZILECT) 0.5 MG TABS tablet Take 1 tablet (0.5 mg total) by mouth daily. 02/19/18   Penumalli, Earlean Polka, MD  Thiamine HCl (VITAMIN B-1 PO) Take 1 tablet by mouth daily.    [provider]  vitamin E (VITAMIN E) 200 UNIT capsule Take 400 Units by mouth daily.     [provider]     Allergies Cephalexin; Codeine; Crestor [rosuvastatin calcium]; E-mycin [erythromycin base]; Elavil [amitriptyline]; Fosamax [alendronate]; Influenza virus vaccine split; Iodine; Lidocaine; Neurontin [gabapentin]; Novocain [procaine]; Penicillins; Pravastatin; Sulfa drugs cross reactors; Tramadol; Darvon [propoxyphene]; and Mercury   Family History  Problem Relation Age of Onset  . Heart disease Mother   . Heart disease Father   . Heart disease Brother   . Heart disease Maternal Grandmother   . Heart disease Maternal Grandfather   . Stroke Maternal Grandfather   . Heart disease Paternal Grandmother   . Heart disease Paternal Grandfather   . Heart attack Paternal Grandfather     Social History Social History   Tobacco Use  . Smoking status: Never Smoker  . Smokeless tobacco: Never Used  Substance Use Topics  . Alcohol use: No  . Drug use: No    Review of Systems  Constitutional:   No fever or chills.   Cardiovascular:   No chest pain or syncope. Respiratory:   No dyspnea or cough. Gastrointestinal:   Negative for abdominal pain, vomiting and diarrhea.  Musculoskeletal:   positive chest wall pain as above All other systems reviewed and are negative except as documented above in ROS and  HPI.  ____________________________________________   PHYSICAL EXAM:  VITAL SIGNS: ED Triage Vitals  Enc Vitals Group     BP 04/09/18 1845 (!) 154/108     Pulse Rate 04/09/18 1845 95     Resp 04/09/18 1845 18     Temp 04/09/18 1845 98.3 F (36.8 C)     Temp Source 04/09/18 1845 Oral     SpO2 04/09/18 1845 95 %     Weight 04/09/18 1848 113 lb (51.3 kg)     Height 04/09/18 1848 5\' 6"  (1.676 m)     Head Circumference --      Peak Flow --      Pain Score 04/09/18 1848 9     Pain Loc --      Pain Edu? --  Excl. in [place]? --     Vital signs reviewed, nursing assessments reviewed.   Constitutional:   Alert and oriented. very uncomfortable Eyes:   Conjunctivae are normal. EOMI. PERRL. ENT      Head:   Normocephalic and atraumatic.      Nose:   No congestion/rhinnorhea. no epistaxis      Mouth/Throat:   MMM, no pharyngeal erythema. No peritonsillar mass. no intraoral injury      Neck:   No meningismus. Full ROM.no midline spinal tenderness Hematological/Lymphatic/Immunilogical:   No cervical lymphadenopathy. Cardiovascular:   RRR. Symmetric bilateral radial and DP pulses.  No murmurs.  Respiratory:   Normal respiratory effort without tachypnea/retractions. Breath sounds are clear and equal bilaterally. No wheezes/rales/rhonchi. Gastrointestinal:   Soft and nontender. Non distended. There is no CVA tenderness.  No rebound, rigidity, or guarding.  Musculoskeletal:   Normal range of motion in all extremities. No joint effusions.  No lower extremity tenderness.  No edema.no midline spinal tenderness. There is focal tenderness at the right lateral chest wall inferiorly over the falls ribs without crepitus or palpable deformity. Positive bucket handle test. Neurologic:   Normal speech and language.  Motor grossly intact. No acute focal neurologic deficits are appreciated.  Skin:    Skin is warm, dry and intact. No rash noted.  No petechiae, purpura, or bullae.no bruising over the  chest wall. No lacerations.  ____________________________________________    LABS (pertinent positives/negatives) (all labs ordered are listed, but only abnormal results are displayed) Labs Reviewed - No data to display ____________________________________________   EKG    ____________________________________________    RADIOLOGY  Dg Chest 2 View  Result Date: 04/09/2018 CLINICAL DATA:  Right rib pain post fall. EXAM: CHEST - 2 VIEW COMPARISON:  None. FINDINGS: Mildly enlarged heart. Calcific atherosclerotic disease of the aorta. Mediastinal contours appear intact. There is no evidence of focal airspace consolidation, pleural effusion or pneumothorax. Minimally displaced fractures of the posterior right eighth and ninth ribs. Soft tissues are grossly normal. IMPRESSION: No active cardiopulmonary disease. Minimally displaced fractures of the posterior right eighth and ninth ribs. No evidence of pneumothorax. Electronically Signed   By: Fidela Salisbury M.D.   On: 04/09/2018 20:11    ____________________________________________   PROCEDURES Procedures  ____________________________________________    CLINICAL IMPRESSION / ASSESSMENT AND PLAN / ED COURSE  Pertinent labs & imaging results that were available during my care of the patient were reviewed by me and considered in my medical decision making (see chart for details).    patient presents with right chest wall pain after a fall. Clinically she has a rib fracture although her chest x-ray is unremarkable. No evidence of pneumothorax. I doubt pneumonia dissection ACS or any other pathology at this time. Treat the patient with incentive spirometer, Tylenol, low-dose oxycodone, rib belt, follow-up with primary care.      ____________________________________________   FINAL CLINICAL IMPRESSION(S) / ED DIAGNOSES    Final diagnoses:  Chest wall contusion, right, initial encounter  Closed fracture of one rib of right  side, initial encounter     ED Discharge Orders        Ordered    oxyCODONE (ROXICODONE) 5 MG immediate release tablet  Every 6 hours PRN     04/09/18 2058      Portions of this note were generated with dragon dictation software. Dictation errors may occur despite best attempts at proofreading.    Carrie Mew, MD 04/09/18 2102

## 2018-04-09 NOTE — ED Triage Notes (Signed)
Patient presents to the ED with right rib post fall around 4:47pm.  Patient is grimacing in pain and holding right rib area.  Patient hit her ribs on a trash can as she fell.  Patient reports pain with breathing.

## 2018-08-27 ENCOUNTER — Encounter: Payer: Self-pay | Admitting: Diagnostic Neuroimaging

## 2018-08-27 ENCOUNTER — Ambulatory Visit: Payer: Medicare Other | Admitting: Diagnostic Neuroimaging

## 2018-08-27 VITALS — BP 112/76 | HR 76 | Ht 64.0 in | Wt 111.2 lb

## 2018-08-27 DIAGNOSIS — G20 Parkinson's disease: Secondary | ICD-10-CM

## 2018-08-27 DIAGNOSIS — R42 Dizziness and giddiness: Secondary | ICD-10-CM

## 2018-08-27 DIAGNOSIS — G20A1 Parkinson's disease without dyskinesia, without mention of fluctuations: Secondary | ICD-10-CM

## 2018-08-27 DIAGNOSIS — R269 Unspecified abnormalities of gait and mobility: Secondary | ICD-10-CM

## 2018-08-27 MED ORDER — CARBIDOPA-LEVODOPA 25-100 MG PO TABS: 2.0000 | ORAL_TABLET | Freq: Four times a day (QID) | ORAL | 4 refills | Status: DC

## 2018-08-27 NOTE — Progress Notes (Signed)
PATIENT: Sarah Callahan DOB: 07/02/1931   REASON FOR VISIT: follow up for PD HISTORY FROM: patient and Sarah Callahan and son in law  Chief Complaint  Patient presents with  . Parkinson's disease    rm 8, dgtr- Sarah Callahan, son-in-law- Sarah Callahan, "falling; dizziness, lightheadedness, has passed out"  . Follow-up    6 month     HISTORY OF PRESENT ILLNESS:  UPDATE (08/27/18, VRP): Since last visit, doing poorly. Now living with family. Symptoms are progressive. More falls and syncope attacks. Severity is high. Tolerating meds. Using walker, but forget to use at times. Continues with peak dyskinesias.  UPDATE (02/19/18, VRP): Since last visit, doing poorly; multiple falls (4/3, 4/8, 4/12). Went to ER. Had small right frontal SAH on CT head. Followup CT stable. Tolerating CARB/LEVO. No alleviating or aggravating factors.   UPDATE (11/27/17, VRP): Since last visit, doing well. Tolerating meds. Now on carb/levo 25/100 (9 tabs divided over 4 times per day). No alleviating or aggravating factors. Has had more falls since last visit, but these occurred when she was note using cane or walker, and she was "doing things I am not supposed to do" such as cleaning up in the basement, bending and lifting items etc. When she uses cane and rollator, she does well. Now grand-Sarah Callahan Sarah Callahan) and her friends (a married couple) have moved in with patient and great-grand Sarah Callahan, and this arrangement is helping a lot.   UPDATE 05/23/17: Since last visit, had a difficult May 2018; had multiple falls on to the cement and on to the coffee table. Was not using rollator at that time. More stress in Apr and May 2018. Went to ER, and CT head negative. Now feeling better. Now using the rollator more and not having any falls. Tolerating carb/levo. No wearing off.  UPDATE 11/21/16: Since last visit, pt is stable with PD. She has had some falls, but no major injuries. Using a cane and her scooter. Sometimes uses walker. Also  unfortunately pt's husband passed away in 11-19-16 from heart attack (age 33 yrs old). Pt living at home with grand-Sarah Callahan and son-in-law. More fatigue in early AM. Some recent positional vertigo when getting up in the AM. No nausea or vomiting.   UPDATE 05/15/16: Since last visit, doing fair; some wearing off after 7:30am dose --> by 10:30-11 feels wearing off. Afternoon is better. Sleep and gait otherwise stable.   UPDATE 11/17/15: Since last visit, doing well on carb/levo 1.5 tabs QID. Overall doing well. 2-3 wks ago, stood up quickly, then fell backwards and thinks she passed out. Her family was there and caught her from falling to ground. She regained consciousness quickly. No major injuries or post ictal confusion.   UPDATE 12/07/14 (VRP): Since last visit, she feel stable from PD standpoint. No wearing off. Some nightmares and restless legs at night. Still with stress related to family issues. Now only husband and Sarah Callahan are at home.   UPDATE 09/04/14 (VRP): Since last visit, feels better. Taking carb/levo (1.5/1.5/1/1) and doing better. Still with tremor, slowness, stiffness. Still with balance diff. No falls. Uses a walker at times. Her husband had a "light stroke" in July 2015, with good recovery. He does most of driving. Patient still drives a little (daytime). Patient's grand-Sarah Callahan (and her boyfriend) and Sarah Callahan live with them. Patient has Sarah Callahan and another granddaughter who could help her in the future if her health declines.   UPDATE 03/02/14 (VRP): Since last visit, she notes that her PD symptoms are more significant  in AM (with dose 1 and 2) but sig improve with doses 3 and 4. Now having intermittent dystonic posturing of left foot (previously only in right foot). Some restless legs at night. No falls. Uses a scooter or shopping cart for longer distances. Able to get around at home holding on to walls etc.   UPDATE 10/10/13 (LL):  Since last visit, she  started increasing Sinemet from TID to QID, with good results.  Sometimes she is so busy she forgets to take extra dose.  Dystonia is the same in right foot, but she would rather hold off on starting another medication until she has to.  She has custody of her great-granddaughter who is 7 and has ADHD.  UPDATE 07/10/13 (VP): Since last visit, patient is noted some wearing off between dosages. She also is noted dystonia in the right foot. This has been happening over the past 2-3 months. She also has had increasing falls and balance problems. Patient is alone for this visit. She is not using a walker or cane.   UPDATE 05/29/12: Doing well. Taking carb/levo TID. No wearing off. No dyskinesias. She is happy with current dosing. PT has helped. Uses a cane for distance walking.   UPDATE 12/22/11: Doing better on carb/levo. notices tremor is worse if she misses a dose. Still with balance diff, and has fallen down. Also found that ther gas fireplace was leaking carbon monoide into the home. Now replaced, and her fatigue is better.   PRIOR HPI: 84 year old right-handed female with history of diabetes, hypothyroidism, osteoporosis, here for evaluation of tremor. patient reports progressive right upper extremity tremor for the past 1-1/2 years. Tremor is mainly present at rest but sometimes when she holds a cup or utensils. Tremor worsens with stress. Patient also reports unsteady gait and balance, intermittent drooling, and constipation. No significant change in sense of taste or smell. No reports of sleep disturbance or hallucination. No family history of Parkinson's disease.    REVIEW OF SYSTEMS: Full 14 system review of systems performed and negative except: weakness confusion runny nose       ALLERGIES: Allergies  Allergen Reactions  . Azilect [Rasagiline] Nausea And Vomiting  . Cephalexin Other (See Comments)    Gets UTI after taking  . Codeine Itching  . Crestor [Rosuvastatin Calcium] Other (See  Comments)    Tired, confused, GI issues  . E-Mycin [Erythromycin Base] Nausea And Vomiting  . Elavil [Amitriptyline] Other (See Comments)  . Fosamax [Alendronate] Other (See Comments)    Body aches  . Influenza Virus Vaccine Split Other (See Comments)    Side effects  . Iodine Other (See Comments)    infection  . Lidocaine Other (See Comments)    Feels hot, turns red  . Neurontin [Gabapentin] Other (See Comments)    Depression  . Novocain [Procaine] Other (See Comments)  . Penicillins Hives  . Pravastatin Other (See Comments)    Bone and joint pain  . Sulfa Drugs Cross Reactors Hives  . Tramadol Other (See Comments)    Muscle pain  . Darvon [Propoxyphene] Rash  . Mercury Rash    HOME MEDICATIONS: Outpatient Medications Prior to Visit  Medication Sig Dispense Refill  . Bioflavonoid Products (VITAMIN C) CHEW Chew by mouth 2 (two) times daily.    . carbidopa-levodopa (SINEMET IR) 25-100 MG tablet Take 2-3 tablets by mouth 4 (four) times daily. 720 tablet 4  . Cholecalciferol (VITAMIN D-3) 5000 units TABS Take 1 tablet by mouth 2 (two) times daily.    Marland Kitchen  Coenzyme Q10 (COQ-10 PO) Take 1 tablet by mouth daily.     Marland Kitchen denosumab (PROLIA) 60 MG/ML SOLN injection Inject 60 mg into the skin every 6 (six) months. Administer in upper arm, thigh, or abdomen    . DHA-EPA-Vitamin E (OMEGA-3 COMPLEX PO) Take 1 capsule by mouth daily.    . Flaxseed, Linseed, (FLAX SEED OIL PO) Take 1 tablet by mouth daily.    . fluticasone (FLONASE) 50 MCG/ACT nasal spray Place 2 sprays into both nostrils as needed.     . furosemide (LASIX) 20 MG tablet Take 1 tablet by mouth daily.    Marland Kitchen levothyroxine (SYNTHROID, LEVOTHROID) 75 MCG tablet Take 75 mcg by mouth daily.    . Magnesium 250 MG TABS Take 250 mg by mouth daily.     . Melatonin 3 MG TABS Take 3 mg by mouth at bedtime.    . Multiple Vitamins-Minerals (HEALTHY EYES PO) Take 1 capsule by mouth 2 (two) times daily.    . Multiple Vitamins-Minerals  (MULTI-VITAMIN GUMMIES PO) Take 2 tablets by mouth daily. gummie    . oxyCODONE (ROXICODONE) 5 MG immediate release tablet Take 0.5 tablets (2.5 mg total) by mouth every 6 (six) hours as needed for breakthrough pain. 8 tablet 0  . PARoxetine (PAXIL) 10 MG tablet Take 10 mg by mouth daily. One tablet daily     . Thiamine HCl (VITAMIN B-1 PO) Take 1 tablet by mouth daily.    . vitamin E (VITAMIN E) 200 UNIT capsule Take 400 Units by mouth daily.     . rasagiline (AZILECT) 0.5 MG TABS tablet Take 1 tablet (0.5 mg total) by mouth daily. 30 tablet 12   No facility-administered medications prior to visit.     PAST MEDICAL HISTORY: Past Medical History:  Diagnosis Date  . Arthritis   . Cancer (HCC)    forehead, basal cell  . Diabetes mellitus    diet controlled  . Dyspepsia   . Falls   . GERD (gastroesophageal reflux disease)   . H/O: rheumatic fever   . Hypercalcemia   . Hypercholesteremia   . Hypothyroidism   . Macular degeneration   . OA (osteoarthritis of spine)    , Back  . Osteoporosis   . Osteoporosis   . Pancreatitis   . Parkinson disease ([employer])   . Peripheral edema   . PONV (postoperative nausea and vomiting)   . Thoracic compression fracture ([place])   . Vitamin D deficiency   . Wears glasses     PAST SURGICAL HISTORY: Past Surgical History:  Procedure Laterality Date  . ABDOMINAL HYSTERECTOMY    . APPENDECTOMY    . CARPAL TUNNEL RELEASE     both  . CATARACT EXTRACTION    . COLONOSCOPY    . EYE SURGERY     both cataracts  . FOOT SURGERY  2009   hammer toes  . HAND SURGERY    . HEMORRHOID SURGERY    . MOHS SURGERY Right 09/2015   forehead, basal cell  . TONSILLECTOMY    . TRIGGER FINGER RELEASE Right 04/30/2013   Procedure: RELEASE TRIGGER FINGER/A-1 PULLEY RIGHT RING FINGER;  Surgeon: Wynonia Sours, MD;  Location: [facility];  Service: Orthopedics;  Laterality: Right;    FAMILY HISTORY: Family History  Problem Relation Age of Onset  . Heart  disease Mother   . Heart disease Father   . Heart disease Brother   . Heart disease Maternal Grandmother   . Heart disease  Maternal Grandfather   . Stroke Maternal Grandfather   . Heart disease Paternal Grandmother   . Heart disease Paternal Grandfather   . Heart attack Paternal Grandfather     SOCIAL HISTORY: Social History   Socioeconomic History  . Marital status: Married    Spouse name: Barnabas Lister  . Number of children: 3  . Years of education: College  . Highest education level: Not on file  Occupational History  . Occupation: retired    Comment: n/a  Social Needs  . Financial resource strain: Not on file  . Food insecurity:    Worry: Not on file    Inability: Not on file  . Transportation needs:    Medical: Not on file    Non-medical: Not on file  Tobacco Use  . Smoking status: Never Smoker  . Smokeless tobacco: Never Used  Substance and Sexual Activity  . Alcohol use: No  . Drug use: No  . Sexual activity: Not on file  Lifestyle  . Physical activity:    Days per week: Not on file    Minutes per session: Not on file  . Stress: Not on file  Relationships  . Social connections:    Talks on phone: Not on file    Gets together: Not on file    Attends religious service: Not on file    Active member of club or organization: Not on file    Attends meetings of clubs or organizations: Not on file    Relationship status: Not on file  . Intimate partner violence:    Fear of current or ex partner: Not on file    Emotionally abused: Not on file    Physically abused: Not on file    Forced sexual activity: Not on file  Other Topics Concern  . Not on file  Social History Narrative   Patient lives at home with and great-granddaughter.    3 children, 1 deceased    Caffeine Use: 2-3 cups of coffee in the a.m. daily     PHYSICAL EXAM  Vitals:   08/27/18 1110  BP: 112/76  Pulse: 76  Weight: 111 lb 3.2 oz (50.4 kg)  Height: 5\' 4"  (1.626 m)   No data found.  Body  mass index is 19.09 kg/m.  EXAM:  General: Patient is awake, alert and in no acute distress. Well developed and groomed. POSITIVE MYERSON'S. MASKED FACIES. Neck: Neck is supple.  Cardiovascular: No carotid artery bruits. Heart is regular rate and rhythm with no murmurs.   Neurologic Exam  Mental Status: Awake, alert. Language is fluent and comprehension intact.  Cranial Nerves: Pupils are equal and reactive to light. Visual fields are full to confrontation. Conjugate eye movements are full and symmetric. Facial sensation and strength are symmetric. Hearing is intact. Palate elevated symmetrically and uvula is midline. Shoulder shrug is symmetric. Tongue is midline. SOFT, HOARSE VOICE.  Motor: MODERATE RESTLESS, DYSKINESIAS IN UPPER AND LOWER EXT; COGWHEELING IN RUE > LUE. BRADYKINESIA IN BUE AND BLE. Full strength in the upper and lower extremities. No pronator drift.  Sensory: Intact and symmetric to light touch  Coordination: No ataxia or dysmetria on finger-nose or rapid alternating movement testing.  Gait and Station: STOOPED POSTURE. SEVERE UNSTEADY GAIT. SIGNIFICANT KYPHOSIS-SCOLIOSIS. POOR ARM SWING. SLOW, SHORT STEPS. Reflexes: Deep tendon reflexes in the upper and lower extremity are present and symmetric. ABSENT AT ANKLES.   DIAGNOSTIC DATA (LABS, IMAGING, TESTING) - I reviewed patient records, labs, notes, testing and imaging myself where  available.  Lab Results  Component Value Date   WBC 5.8 02/06/2018   HGB 12.2 02/06/2018   HCT 37.9 02/06/2018   MCV 93.8 02/06/2018   PLT 248 02/06/2018      Component Value Date/Time   NA 141 04/30/2013 0820   K 4.0 04/30/2013 0820   CL 99 04/30/2013 0820   CO2 30 09/07/2008 1600   GLUCOSE 122* 04/30/2013 0820   BUN 18 04/30/2013 0820   CREATININE 0.70 04/30/2013 0820   CALCIUM 9.8 09/07/2008 1600   GFRNONAA >60 09/07/2008 1600   GFRAA  Value: >60   09/07/2008 1600    10/26/11 MRI brain - mild bitemporal atrophy   02/06/18 CT HEAD: 1.  Acute small volume RIGHT frontal subarachnoid hemorrhage. 2. Moderate RIGHT frontal scalp hematoma.  No skull fracture. 3. Stable moderate parenchymal brain volume loss and mild chronic small vessel ischemic disease.  02/06/18 CT ORBITS: 1. RIGHT periorbital soft tissue swelling/contusion without postseptal involvement.  02/06/18 CT CERVICAL SPINE:  1. No acute fracture or malalignment. 2. Moderate to severe C4-5 and C5-6 neural foraminal narrowing.  02/15/18 CT head  - Small volume subarachnoid hemorrhage over the RIGHT frontal convexity is improved compared with 02/06/2018, but still visible. No new areas of hemorrhage are observed. - Stable atrophy and small vessel disease.  No skull fracture.   ASSESSMENT AND PLAN 84 y.o. female with diabetes, hypothyroidism, osteoporosis, with progressive right upper extremity resting tremor. She has cogwheel rigidity, bradykinesia, stooped posture, and Myerson's sign on exam. Continued progression of disease. Had orthostatic hypotension event / syncope in Jan 2017, due to rapidly standing. Has significant gait difficulty.   Meds tried: carb/levo, rasagiline (nausea)   Dx: idiopathic Parkinson's disease   Parkinson's disease ([place])  Dizziness and giddiness  Gait difficulty   PLAN:   PARKINSON'S DISEASE (established problem, worsening) - continue carbidopa-levodopa dosing (2-3 tabs; 4x per day; total 8-10 tabs per day) 8am - 3 tabs noon - 3 tabs 4pm - 2 tabs  8pm: 1 tabs - use walker and wheelchair  NEUROGENIC ORTHOSTATIC HYPOTENSION - use salt, compression stockings, elevate head of bed - monitor BP at home - consider florinef, midodrine, droxidopa - consider follow up with cardiology  Meds ordered this encounter  Medications  . carbidopa-levodopa (SINEMET IR) 25-100 MG tablet    Sig: Take 2-3 tablets by mouth 4 (four) times daily.    Dispense:  720 tablet    Refill:  4   Return in about 1 year (around 08/28/2019).     Penni Bombard, MD 74/16/3845, 36:46 AM Certified in Neurology, Neurophysiology and Neuroimaging  [facility] [address], [place], [place] [address] [phone]

## 2018-08-27 NOTE — Patient Instructions (Signed)
  PARKINSON'S DISEASE - continue carbidopa-levodopa dosing (2-3 tabs; 4x per day; total 8-10 tabs per day) - use walker and wheelchair  NEUROGENIC ORTHOSTATIC HYPOTENSION - use salt, compression stockings, elevate head of bed - monitor BP at home - consider follow up with cardiology

## 2019-01-23 DIAGNOSIS — C4492 Squamous cell carcinoma of skin, unspecified: Secondary | ICD-10-CM

## 2019-01-23 HISTORY — DX: Squamous cell carcinoma of skin, unspecified: C44.92

## 2019-06-03 IMAGING — CT CT HEAD W/O CM
1 series · 15 of 30 positions shown, 19 images · non-contrast
Comparison: CT head from [REDACTED]
02/06/2018.

CLINICAL DATA: Fell, confusion, double vision and dizziness.

EXAM:
CT HEAD WITHOUT CONTRAST
TECHNIQUE: Contiguous axial images were obtained from the base of the skull
through the vertex without intravenous contrast.

[Series 2: head w/(date) · axial · 0.41mm/px · z∈[+1207,+1347]mm · 15 of 32 slices shown, 19 images]
[im 2/32  brain]
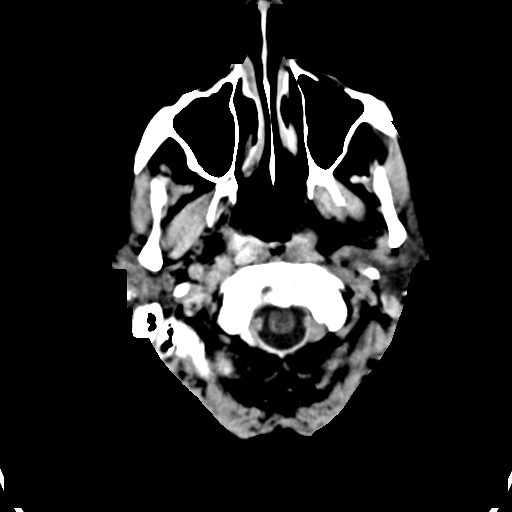
[im 2/32  bone]
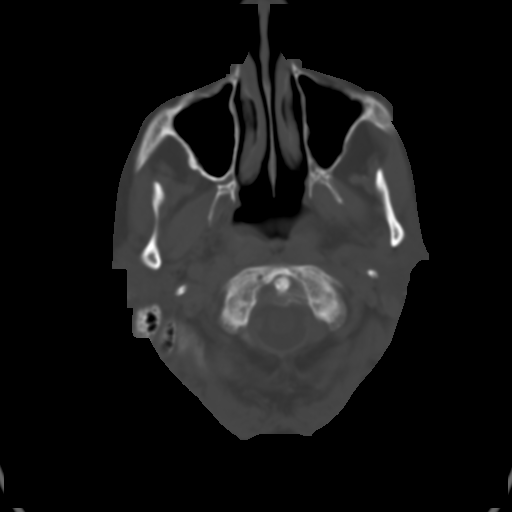
[im 4/32  brain]
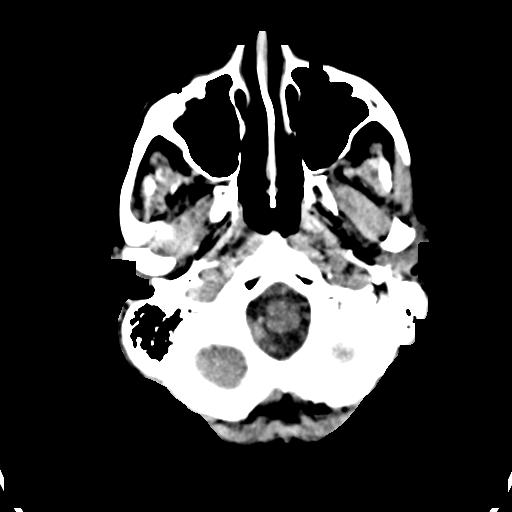
[im 6/32  brain]
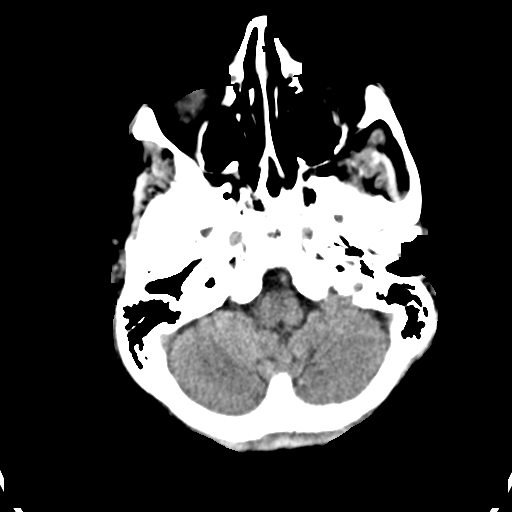
[im 8/32  brain]
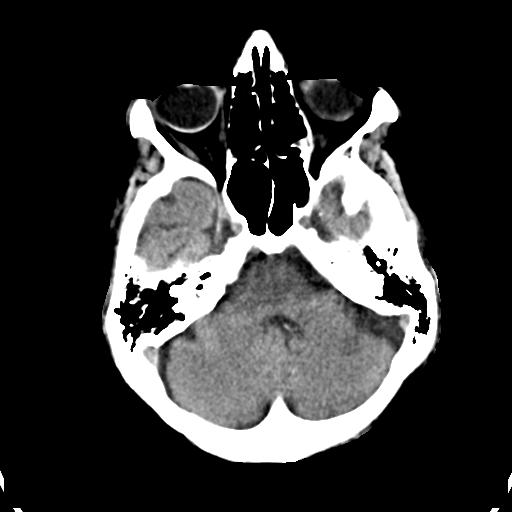
[im 10/32  brain]
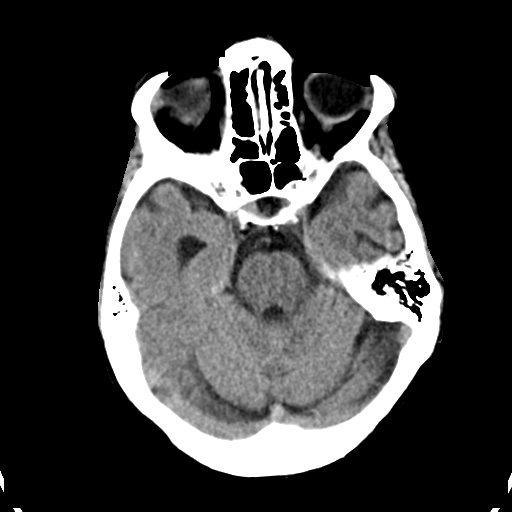
[im 10/32  bone]
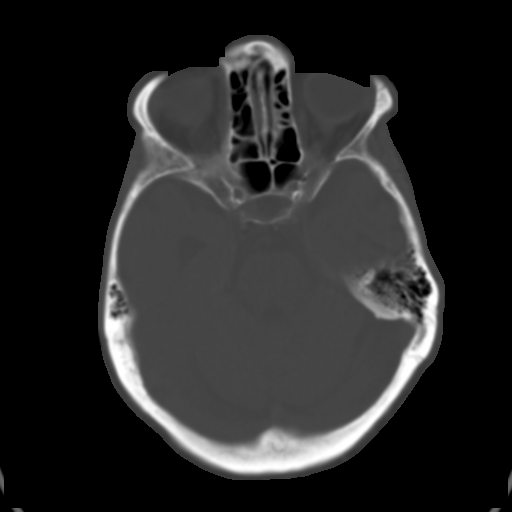
[im 12/32  brain]
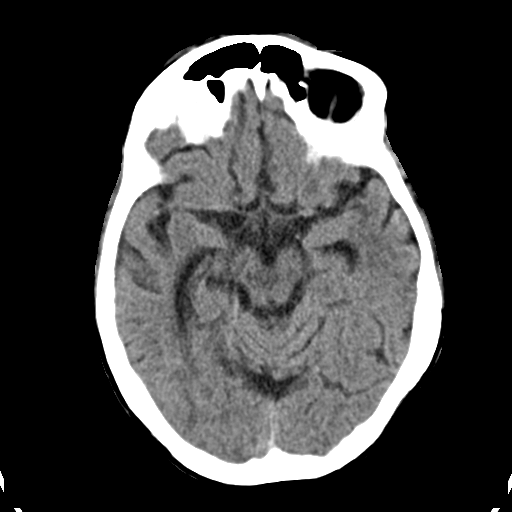
[im 14/32  brain]
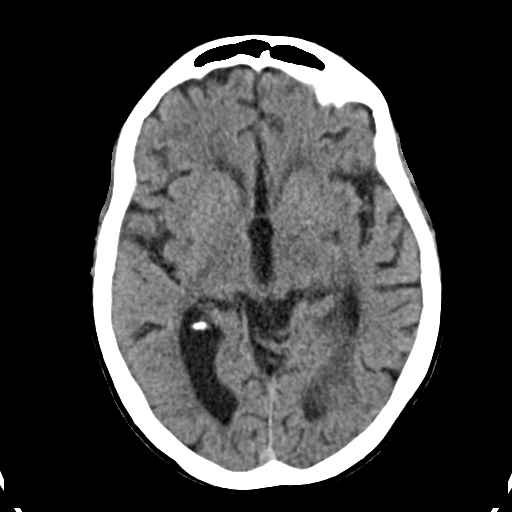
[im 17/32  brain]
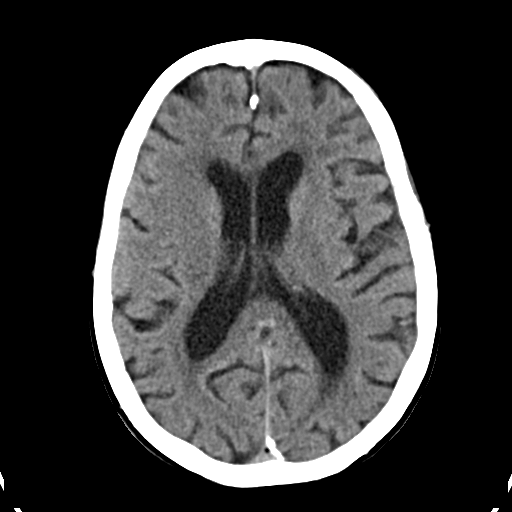
[im 18/32  brain]
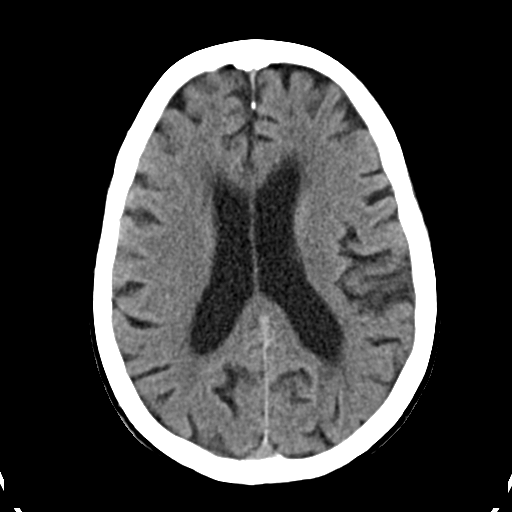
[im 18/32  bone]
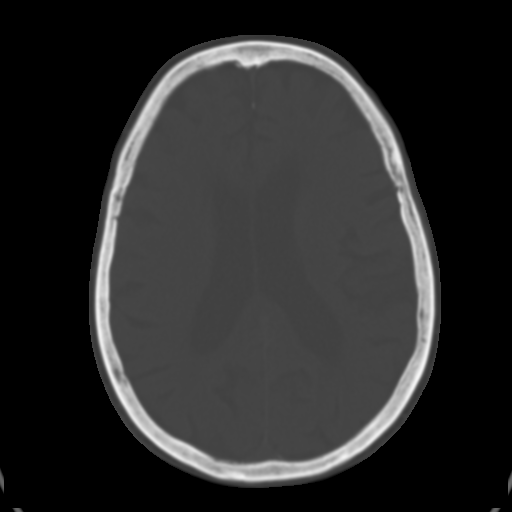
[im 20/32  brain]
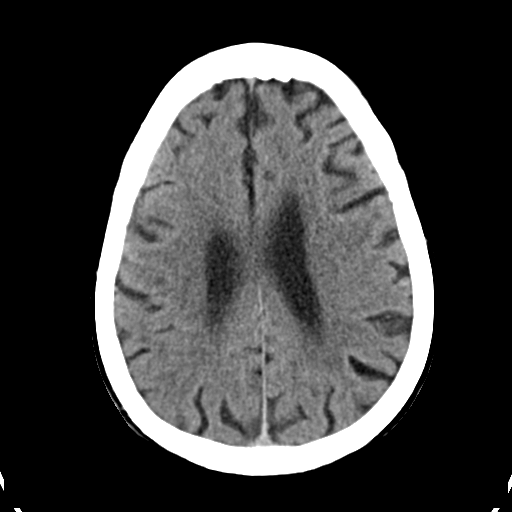
[im 22/32  brain]
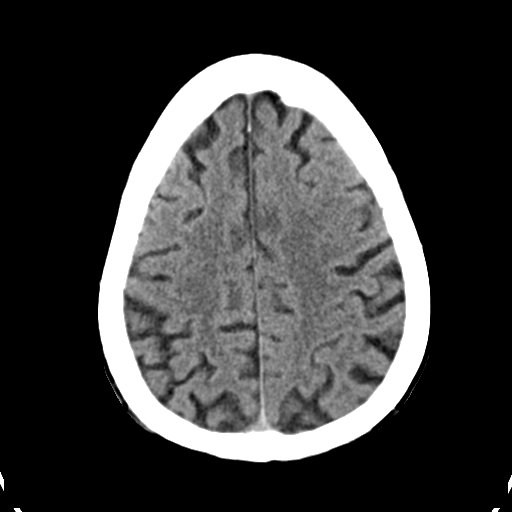
[im 24/32  brain]
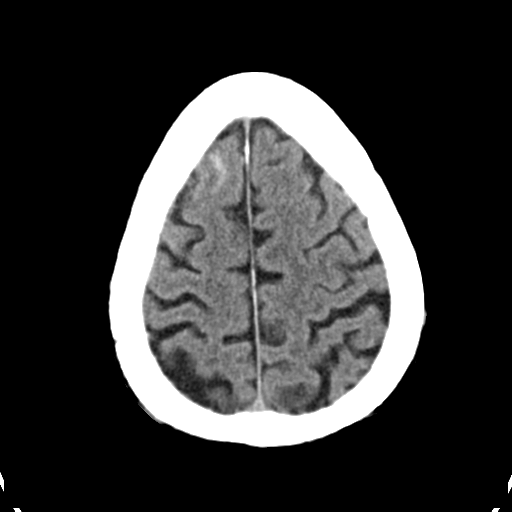
[im 26/32  brain]
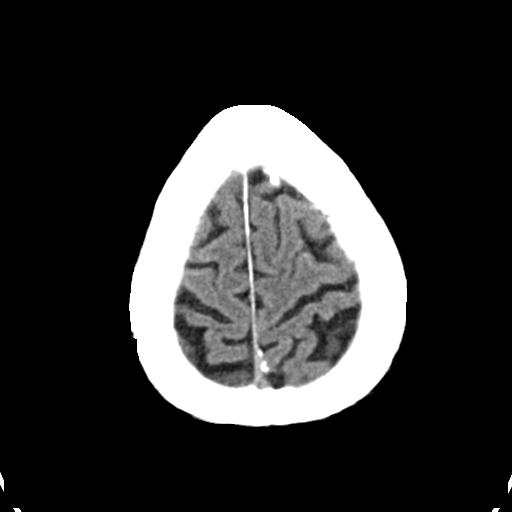
[im 26/32  bone]
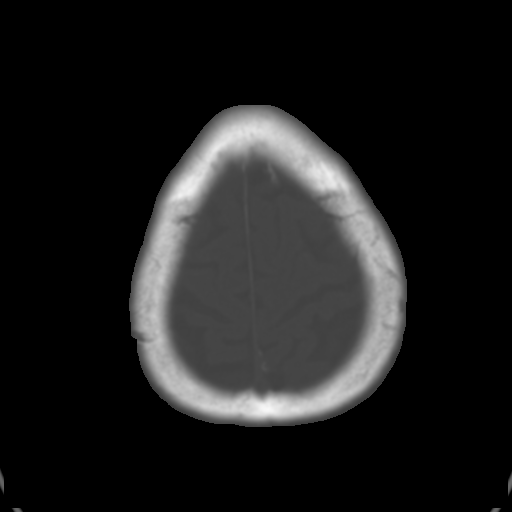
[im 28/32  brain]
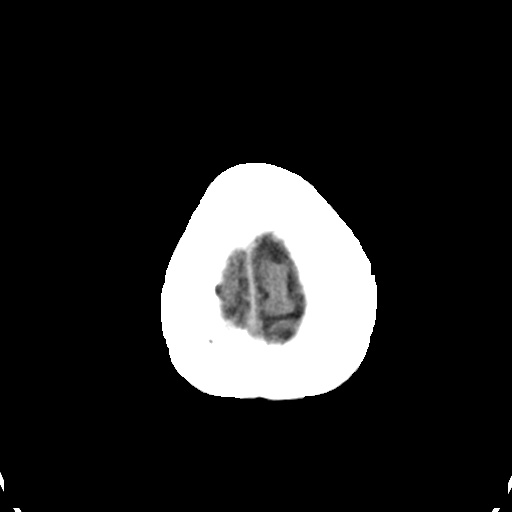
[im 30/32  brain]
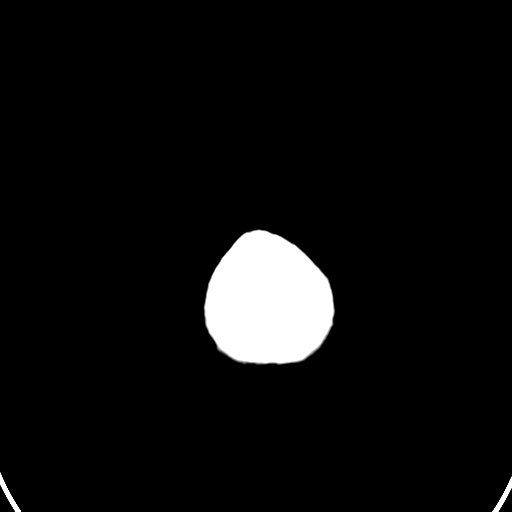

[15 of 30 positions shown; findings below may reference images not displayed]

FINDINGS: Brain: Small volume subarachnoid hemorrhage over the RIGHT frontal
convexity is redemonstrated within the sulci, slightly improved from
priors. No new areas of intracranial hemorrhage. No acute stroke,
hydrocephalus, mass lesion, or subdural/epidural collection. Atrophy
with chronic microvascular ischemic change redemonstrated.

Vascular: Calcification of the cavernous internal carotid arteries
consistent with cerebrovascular atherosclerotic disease. No signs of
intracranial large vessel occlusion.

Skull: Normal. Negative for fracture or focal lesion.

Sinuses/Orbits: No layering fluid.  BILATERAL cataract extraction.

Other: Improving scalp hematoma.
IMPRESSION: Small volume subarachnoid hemorrhage over the RIGHT frontal
convexity is improved compared with 02/06/2018, but still visible.
No new areas of hemorrhage are observed.

Stable atrophy and small vessel disease.  No skull fracture.

## 2019-06-26 DIAGNOSIS — C4491 Basal cell carcinoma of skin, unspecified: Secondary | ICD-10-CM

## 2019-06-26 HISTORY — DX: Basal cell carcinoma of skin, unspecified: C44.91

## 2019-09-03 ENCOUNTER — Ambulatory Visit: Payer: Medicare Other | Admitting: Diagnostic Neuroimaging

## 2019-09-03 ENCOUNTER — Encounter: Payer: Self-pay | Admitting: Diagnostic Neuroimaging

## 2019-09-03 ENCOUNTER — Other Ambulatory Visit: Payer: Self-pay

## 2019-09-03 VITALS — BP 116/68 | Temp 97.3°F | Ht 64.0 in | Wt 124.8 lb

## 2019-09-03 DIAGNOSIS — R269 Unspecified abnormalities of gait and mobility: Secondary | ICD-10-CM | POA: Diagnosis not present

## 2019-09-03 DIAGNOSIS — G20 Parkinson's disease: Secondary | ICD-10-CM | POA: Diagnosis not present

## 2019-09-03 MED ORDER — CARBIDOPA-LEVODOPA 25-100 MG PO TABS: 1.0000 | ORAL_TABLET | Freq: Four times a day (QID) | ORAL | 4 refills | Status: DC

## 2019-09-03 NOTE — Progress Notes (Signed)
PATIENT: Sarah Callahan DOB: [birth year]   REASON FOR VISIT: follow up for PD HISTORY FROM: patient and daughter and son in law  Chief Complaint  Patient presents with  . Parkinson's disease    rm 9 One yr FU, dgtr- Bettina Gavia- son-in-law, "doing well"     HISTORY OF PRESENT ILLNESS:  UPDATE (09/03/19, VRP): Since last visit, doing about the same. Some falls in last few weeks (bathroom). Less activity during pandemic. Now on carb/levo 1 tab 4x per day (every 4 hours).   UPDATE (08/27/18, VRP): Since last visit, doing poorly. Now living with family. Symptoms are progressive. More falls and syncope attacks. Severity is high. Tolerating meds. Using walker, but forget to use at times. Continues with peak dyskinesias.  UPDATE (02/19/18, VRP): Since last visit, doing poorly; multiple falls (4/3, 4/8, 4/12). Went to ER. Had small right frontal SAH on CT head. Followup CT stable. Tolerating CARB/LEVO. No alleviating or aggravating factors.   UPDATE (11/27/17, VRP): Since last visit, doing well. Tolerating meds. Now on carb/levo 25/100 (9 tabs divided over 4 times per day). No alleviating or aggravating factors. Has had more falls since last visit, but these occurred when she was note using cane or walker, and she was "doing things I am not supposed to do" such as cleaning up in the basement, bending and lifting items etc. When she uses cane and rollator, she does well. Now grand-daughter [name] and her friends (a married couple) have moved in with patient and great-grand daughter, and this arrangement is helping a lot.   UPDATE 05/23/17: Since last visit, had a difficult May 2018; had multiple falls on to the cement and on to the coffee table. Was not using rollator at that time. More stress in Apr and May 2018. Went to ER, and CT head negative. Now feeling better. Now using the rollator more and not having any falls. Tolerating carb/levo. No wearing off.  UPDATE 11/21/16: Since last visit, pt  is stable with PD. She has had some falls, but no major injuries. Using a cane and her scooter. Sometimes uses walker. Also unfortunately pt's husband passed away in 01-Nov-2016 from heart attack (age 23 yrs old). Pt living at home with grand-daughter and son-in-law. More fatigue in early AM. Some recent positional vertigo when getting up in the AM. No nausea or vomiting.   UPDATE 05/15/16: Since last visit, doing fair; some wearing off after 7:30am dose --> by 10:30-11 feels wearing off. Afternoon is better. Sleep and gait otherwise stable.   UPDATE 11/17/15: Since last visit, doing well on carb/levo 1.5 tabs QID. Overall doing well. 2-3 wks ago, stood up quickly, then fell backwards and thinks she passed out. Her family was there and caught her from falling to ground. She regained consciousness quickly. No major injuries or post ictal confusion.   UPDATE 12/07/14 (VRP): Since last visit, she feel stable from PD standpoint. No wearing off. Some nightmares and restless legs at night. Still with stress related to family issues. Now only husband and great-grand-daughter are at home.   UPDATE 09/04/14 (VRP): Since last visit, feels better. Taking carb/levo (1.5/1.5/1/1) and doing better. Still with tremor, slowness, stiffness. Still with balance diff. No falls. Uses a walker at times. Her husband had a "light stroke" in July 2015, with good recovery. He does most of driving. Patient still drives a little (daytime). Patient's grand-daughter (and her boyfriend) and great-grand-daughter live with them. Patient has daughter and another granddaughter who could help  her in the future if her health declines.   UPDATE 03/02/14 (VRP): Since last visit, she notes that her PD symptoms are more significant in AM (with dose 1 and 2) but sig improve with doses 3 and 4. Now having intermittent dystonic posturing of left foot (previously only in right foot). Some restless legs at night. No falls. Uses a scooter or shopping cart for  longer distances. Able to get around at home holding on to walls etc.   UPDATE 10/10/13 (LL):  Since last visit, she started increasing Sinemet from TID to QID, with good results.  Sometimes she is so busy she forgets to take extra dose.  Dystonia is the same in right foot, but she would rather hold off on starting another medication until she has to.  She has custody of her great-granddaughter who is 7 and has ADHD.  UPDATE 07/10/13 (VP): Since last visit, patient is noted some wearing off between dosages. She also is noted dystonia in the right foot. This has been happening over the past 2-3 months. She also has had increasing falls and balance problems. Patient is alone for this visit. She is not using a walker or cane.   UPDATE 05/29/12: Doing well. Taking carb/levo TID. No wearing off. No dyskinesias. She is happy with current dosing. PT has helped. Uses a cane for distance walking.   UPDATE 12/22/11: Doing better on carb/levo. notices tremor is worse if she misses a dose. Still with balance diff, and has fallen down. Also found that ther gas fireplace was leaking carbon monoide into the home. Now replaced, and her fatigue is better.   PRIOR HPI: 85 year old right-handed female with history of diabetes, hypothyroidism, osteoporosis, here for evaluation of tremor. patient reports progressive right upper extremity tremor for the past 1-1/2 years. Tremor is mainly present at rest but sometimes when she holds a cup or utensils. Tremor worsens with stress. Patient also reports unsteady gait and balance, intermittent drooling, and constipation. No significant change in sense of taste or smell. No reports of sleep disturbance or hallucination. No family history of Parkinson's disease.    REVIEW OF SYSTEMS: Full 14 system review of systems performed and negative except: weakness confusion runny nose       ALLERGIES: Allergies  Allergen Reactions  . Azilect [Rasagiline] Nausea And Vomiting  . Cephalexin  Other (See Comments)    Gets UTI after taking  . Codeine Itching  . Crestor [Rosuvastatin Calcium] Other (See Comments)    Tired, confused, GI issues  . E-Mycin [Erythromycin Base] Nausea And Vomiting  . Elavil [Amitriptyline] Other (See Comments)  . Fosamax [Alendronate] Other (See Comments)    Body aches  . Influenza Virus Vaccine Split Other (See Comments)    Side effects  . Iodine Other (See Comments)    infection  . Lidocaine Other (See Comments)    Feels hot, turns red  . Neurontin [Gabapentin] Other (See Comments)    Depression  . Novocain [Procaine] Other (See Comments)  . Penicillins Hives  . Pravastatin Other (See Comments)    Bone and joint pain  . Sulfa Drugs Cross Reactors Hives  . Tramadol Other (See Comments)    Muscle pain  . Darvon [Propoxyphene] Rash  . Mercury Rash    HOME MEDICATIONS: Outpatient Medications Prior to Visit  Medication Sig Dispense Refill  . Bioflavonoid Products (VITAMIN C) CHEW Chew by mouth 2 (two) times daily.    . carbidopa-levodopa (SINEMET IR) 25-100 MG tablet Take 2-3 tablets by  mouth 4 (four) times daily. 720 tablet 4  . Cholecalciferol (VITAMIN D-3) 5000 units TABS Take 1 tablet by mouth 2 (two) times daily.    . Coenzyme Q10 (COQ-10 PO) Take 1 tablet by mouth daily.     Marland Kitchen denosumab (PROLIA) 60 MG/ML SOLN injection Inject 60 mg into the skin every 6 (six) months. Administer in upper arm, thigh, or abdomen    . DHA-EPA-Vitamin E (OMEGA-3 COMPLEX PO) Take 1 capsule by mouth daily.    . Flaxseed, Linseed, (FLAX SEED OIL PO) Take 1 tablet by mouth daily.    . fluticasone (FLONASE) 50 MCG/ACT nasal spray Place 2 sprays into both nostrils as needed.     . furosemide (LASIX) 20 MG tablet Take 1 tablet by mouth daily.    Marland Kitchen levothyroxine (SYNTHROID, LEVOTHROID) 75 MCG tablet Take 75 mcg by mouth daily.    . Magnesium 250 MG TABS Take 250 mg by mouth daily.     . Melatonin 3 MG TABS Take 3 mg by mouth at bedtime.    . Multiple  Vitamins-Minerals (HEALTHY EYES PO) Take 1 capsule by mouth 2 (two) times daily.    . Multiple Vitamins-Minerals (MULTI-VITAMIN GUMMIES PO) Take 2 tablets by mouth daily. gummie    . oxyCODONE (ROXICODONE) 5 MG immediate release tablet Take 0.5 tablets (2.5 mg total) by mouth every 6 (six) hours as needed for breakthrough pain. 8 tablet 0  . PARoxetine (PAXIL) 10 MG tablet Take 10 mg by mouth daily. One tablet daily     . Thiamine HCl (VITAMIN B-1 PO) Take 1 tablet by mouth daily.    . vitamin E (VITAMIN E) 200 UNIT capsule Take 400 Units by mouth daily.      No facility-administered medications prior to visit.     PAST MEDICAL HISTORY: Past Medical History:  Diagnosis Date  . Arthritis   . Cancer (HCC)    forehead, basal cell  . Diabetes mellitus    diet controlled  . Dyspepsia   . Falls   . GERD (gastroesophageal reflux disease)   . H/O: rheumatic fever   . Hypercalcemia   . Hypercholesteremia   . Hypothyroidism   . Macular degeneration   . OA (osteoarthritis of spine)    , Back  . Osteoporosis   . Osteoporosis   . Pancreatitis   . Parkinson disease ([place])   . Peripheral edema   . PONV (postoperative nausea and vomiting)   . Thoracic compression fracture ([place])   . Vitamin D deficiency   . Wears glasses     PAST SURGICAL HISTORY: Past Surgical History:  Procedure Laterality Date  . ABDOMINAL HYSTERECTOMY    . APPENDECTOMY    . CARPAL TUNNEL RELEASE     both  . CATARACT EXTRACTION    . COLONOSCOPY    . EYE SURGERY     both cataracts  . FOOT SURGERY  2009   hammer toes  . HAND SURGERY    . HEMORRHOID SURGERY    . MOHS SURGERY Right 09/2015, 2020   forehead, basal cell  . TONSILLECTOMY    . TRIGGER FINGER RELEASE Right 04/30/2013   Procedure: RELEASE TRIGGER FINGER/A-1 PULLEY RIGHT RING FINGER;  Surgeon: Wynonia Sours, MD;  Location: [facility];  Service: Orthopedics;  Laterality: Right;    FAMILY HISTORY: Family History  Problem Relation Age  of Onset  . Heart disease Mother   . Heart disease Father   . Heart disease Brother   .  Heart disease Maternal Grandmother   . Heart disease Maternal Grandfather   . Stroke Maternal Grandfather   . Heart disease Paternal Grandmother   . Heart disease Paternal Grandfather   . Heart attack Paternal Grandfather     SOCIAL HISTORY: Social History   Socioeconomic History  . Marital status: Married    Spouse name: Barnabas Lister  . Number of children: 3  . Years of education: College  . Highest education level: Not on file  Occupational History  . Occupation: retired    Comment: n/a  Social Needs  . Financial resource strain: Not on file  . Food insecurity    Worry: Not on file    Inability: Not on file  . Transportation needs    Medical: Not on file    Non-medical: Not on file  Tobacco Use  . Smoking status: Never Smoker  . Smokeless tobacco: Never Used  Substance and Sexual Activity  . Alcohol use: No  . Drug use: No  . Sexual activity: Not on file  Lifestyle  . Physical activity    Days per week: Not on file    Minutes per session: Not on file  . Stress: Not on file  Relationships  . Social Herbalist on phone: Not on file    Gets together: Not on file    Attends religious service: Not on file    Active member of club or organization: Not on file    Attends meetings of clubs or organizations: Not on file    Relationship status: Not on file  . Intimate partner violence    Fear of current or ex partner: Not on file    Emotionally abused: Not on file    Physically abused: Not on file    Forced sexual activity: Not on file  Other Topics Concern  . Not on file  Social History Narrative   09/03/19 Patient lives with dghtr, son in law   3 children, 1 deceased    Caffeine Use: 2-3 cups of coffee in the a.m. daily     PHYSICAL EXAM  Vitals:   09/03/19 1303  BP: 116/68  Temp: (!) 97.3 F (36.3 C)  Weight: 124 lb 12.8 oz (56.6 kg)  Height: 5\' 4"  (1.626 m)    No data found.  Body mass index is 21.42 kg/m.  EXAM:  General: Patient is awake, alert and in no acute distress. Well developed and groomed. POSITIVE MYERSON'S. MASKED FACIES. Neck: Neck is supple.  Cardiovascular: No carotid artery bruits. Heart is regular rate and rhythm with no murmurs.   Neurologic Exam  Mental Status: Awake, alert. Language is fluent and comprehension intact.  Cranial Nerves: Pupils are equal and reactive to light. Visual fields are full to confrontation. Conjugate eye movements are full and symmetric. Facial sensation and strength are symmetric. Hearing is intact. Palate elevated symmetrically and uvula is midline. Shoulder shrug is symmetric. Tongue is midline. SOFT, HOARSE VOICE.  Motor: MILD DYSKINESIAS IN UPPER AND LOWER EXT; COGWHEELING IN RUE > LUE. MODERATE BRADYKINESIA IN BUE AND BLE. DIFFUSE 4/5 strength in the upper and lower extremities.  Sensory: Intact and symmetric to light touch  Coordination: No ataxia or dysmetria on finger-nose or rapid alternating movement testing.  Reflexes: Deep tendon reflexes in the upper and lower extremity are present and symmetric. ABSENT AT ANKLES. Gait and Station: IN [employer]; LEANING TO RIGHT SIDE   DIAGNOSTIC DATA (LABS, IMAGING, TESTING) - I reviewed patient records, labs, notes, testing  and imaging myself where available.  Lab Results  Component Value Date   WBC 5.8 02/06/2018   HGB 12.2 02/06/2018   HCT 37.9 02/06/2018   MCV 93.8 02/06/2018   PLT 248 02/06/2018      Component Value Date/Time   NA 141 04/30/2013 0820   K 4.0 04/30/2013 0820   CL 99 04/30/2013 0820   CO2 30 09/07/2008 1600   GLUCOSE 122* 04/30/2013 0820   BUN 18 04/30/2013 0820   CREATININE 0.70 04/30/2013 0820   CALCIUM 9.8 09/07/2008 1600   GFRNONAA >60 09/07/2008 1600   GFRAA  Value: >60   09/07/2008 1600    10/26/11 MRI brain - mild bitemporal atrophy   02/06/18 CT HEAD: 1. Acute small volume RIGHT frontal subarachnoid hemorrhage.  2. Moderate RIGHT frontal scalp hematoma.  No skull fracture. 3. Stable moderate parenchymal brain volume loss and mild chronic small vessel ischemic disease.  02/06/18 CT ORBITS: 1. RIGHT periorbital soft tissue swelling/contusion without postseptal involvement.  02/06/18 CT CERVICAL SPINE:  1. No acute fracture or malalignment. 2. Moderate to severe C4-5 and C5-6 neural foraminal narrowing.  02/15/18 CT head  - Small volume subarachnoid hemorrhage over the RIGHT frontal convexity is improved compared with 02/06/2018, but still visible. No new areas of hemorrhage are observed. - Stable atrophy and small vessel disease.  No skull fracture.   ASSESSMENT AND PLAN 85 y.o. female with diabetes, hypothyroidism, osteoporosis, with progressive right upper extremity resting tremor. She has cogwheel rigidity, bradykinesia, stooped posture, and Myerson's sign on exam. Continued progression of disease. Had orthostatic hypotension event / syncope in Jan 2017, due to rapidly standing. Has significant gait difficulty.   Meds tried: carb/levo, rasagiline (nausea)  Dx: idiopathic Parkinson's disease   No diagnosis found.   PLAN:   PARKINSON'S DISEASE (established problem, worsening) - continue carbidopa-levodopa dosing (1 tab 4x per day; every 4 hours) - use walker and wheelchair  NEUROGENIC ORTHOSTATIC HYPOTENSION - use salt, compression stockings, elevate head of bed - monitor BP at home - consider florinef, midodrine, droxidopa - consider follow up with cardiology  Meds ordered this encounter  Medications  . carbidopa-levodopa (SINEMET IR) 25-100 MG tablet    Sig: Take 1 tablet by mouth 4 (four) times daily.    Dispense:  360 tablet    Refill:  4   Return in about 1 year (around 09/02/2020).    Penni Bombard, MD [id number], [id number] PM Certified in Neurology, Neurophysiology and Neuroimaging  [facility] [address], [place], [place] [address]  [phone]

## 2020-01-10 ENCOUNTER — Other Ambulatory Visit: Payer: Self-pay | Admitting: Diagnostic Neuroimaging

## 2020-02-18 ENCOUNTER — Other Ambulatory Visit: Payer: Self-pay

## 2020-02-18 ENCOUNTER — Ambulatory Visit: Payer: Medicare Other | Admitting: Dermatology

## 2020-02-18 DIAGNOSIS — Z85828 Personal history of other malignant neoplasm of skin: Secondary | ICD-10-CM | POA: Diagnosis not present

## 2020-02-18 DIAGNOSIS — L570 Actinic keratosis: Secondary | ICD-10-CM | POA: Diagnosis not present

## 2020-02-18 DIAGNOSIS — L821 Other seborrheic keratosis: Secondary | ICD-10-CM | POA: Diagnosis not present

## 2020-02-18 DIAGNOSIS — L578 Other skin changes due to chronic exposure to nonionizing radiation: Secondary | ICD-10-CM | POA: Diagnosis not present

## 2020-02-18 NOTE — Patient Instructions (Addendum)
Recommend daily broad spectrum sunscreen SPF 30+ to sun-exposed areas, reapply every 2 hours as needed. Call for new or changing lesions.  Cryotherapy Aftercare  . Wash gently with soap and water everyday.   Marland Kitchen Apply Vaseline and Band-Aid daily until healed.   Gentle Skin Care Guide  1. Bathe no more than once a day.  2. Avoid bathing in hot water  3. Use a mild soap like Dove, Vanicream, Cetaphil, CeraVe. Can use Lever 2000 or       Cetaphil antibacterial soap  4. Use soap only where you need it. On most days, use it under your arms, between       your legs, and on your fee. Let the water rinse other areas unless visibly dirty.  5. When you get out of the bath/shower, use a towel to gently blot your skin dry, don't           rub it.  6. While your skin is still a little damp, apply a moisturizing cream such as Vanicream,       CeraVe, Cetaphil, Eucerin, Sarna lotion or plain Vaseline Jelly. For hands apply        Neutrogena [place] Hand Cream or Excipial Hand Cream.  7. Reapply moisturizer any time you start to itch or feel dry.  8. Sometimes using free and clear laundry detergents can be helpful. Fabric softener       sheets should be avoided. Downy Free & Gentle liquid, or any liquid fabric softener       that is free of dyes and perfumes, it acceptable to use  9. If your doctor has given you prescription creams you may apply moisturizers over       them

## 2020-02-18 NOTE — Progress Notes (Addendum)
   Follow-Up Visit   Subjective  Sarah Callahan is a 86 y.o. female who presents for the following: Follow-up (AK follow up x 3 on right forearm).  Patient advises there is a new place on left face/neck that has come up since last appt. Not bothersome per patient. Patient does have a history of BCC, SCC.  The following portions of the chart were reviewed this encounter and updated as appropriate: Tobacco  Allergies  Meds  Problems  Med Hx  Surg Hx  Fam Hx      Review of Systems: No other skin or systemic complaints.  Objective  Well appearing patient in no apparent distress; mood and affect are within normal limits.  A focused examination was performed including face, arms, ears, hands, back, legs. Relevant physical exam findings are noted in the Assessment and Plan.  Objective  Left Forearm x 2, right shin x 2 (4): Erythematous thin papules/macules with gritty scale.   Objective  Left temple: Well healed scar with no evidence of recurrence.   Assessment & Plan    AK (actinic keratosis) (4) Left Forearm x 2, right shin x 2  Destruction of lesion - Left Forearm x 2, right shin x 2 Complexity: simple   Destruction method: cryotherapy   Informed consent: discussed and consent obtained   Lesion destroyed using liquid nitrogen: Yes   Region frozen until ice ball extended beyond lesion: Yes   Outcome: patient tolerated procedure well with no complications   Post-procedure details: wound care instructions given    History of basal cell carcinoma (BCC) Left temple  No evidence of recurrence, call clinic for new or changing lesions.   Seborrheic Keratoses - Stuck-on, waxy, tan-brown papules and plaques  - Discussed benign etiology and prognosis. - Observe - Call for any changes  Actinic Damage - diffuse scaly erythematous macules with underlying dyspigmentation - Recommend daily broad spectrum sunscreen SPF 30+ to sun-exposed areas, reapply every 2 hours as needed.    - Call for new or changing lesions.   Return in about 6 months (around 08/19/2020) for TBSE.   Graciella Belton, RMA, am acting as scribe for Forest Gleason, MD .  Documentation: I have reviewed the above documentation for accuracy and completeness, and I agree with the above.  Forest Gleason, MD

## 2020-03-04 ENCOUNTER — Encounter: Payer: Self-pay | Admitting: Dermatology

## 2020-09-02 ENCOUNTER — Encounter: Payer: Medicare Other | Admitting: Dermatology

## 2020-09-06 ENCOUNTER — Other Ambulatory Visit: Payer: Self-pay

## 2020-09-06 ENCOUNTER — Ambulatory Visit: Payer: Medicare Other | Admitting: Diagnostic Neuroimaging

## 2020-09-06 ENCOUNTER — Encounter: Payer: Self-pay | Admitting: Diagnostic Neuroimaging

## 2020-09-06 VITALS — BP 101/61 | HR 78 | Ht 64.0 in | Wt 128.0 lb

## 2020-09-06 DIAGNOSIS — R269 Unspecified abnormalities of gait and mobility: Secondary | ICD-10-CM | POA: Diagnosis not present

## 2020-09-06 DIAGNOSIS — G20 Parkinson's disease: Secondary | ICD-10-CM | POA: Diagnosis not present

## 2020-09-06 MED ORDER — CARBIDOPA-LEVODOPA 25-100 MG PO TABS: 2.0000 | ORAL_TABLET | Freq: Three times a day (TID) | ORAL | 4 refills | Status: DC

## 2020-09-06 NOTE — Progress Notes (Signed)
PATIENT: Sarah Callahan DOB: [birth year]   REASON FOR VISIT: follow up for PD HISTORY FROM: patient and daughter and son in law  Chief Complaint  Patient presents with  . Parkinson's disease    rm 9, 1 year FU dgtr- [name], son in law [place], "hands, arms jerking more, occas trouble sleeping, urinary incontinence"     HISTORY OF PRESENT ILLNESS:  UPDATE (09/06/20, VRP): Since last visit, some progression of symptoms. On carb/levo 6 tabs per day (2/1/1/2). No alleviating or aggravating factors. Tolerating meds. BP fluctuates. Some dizzy spells.   UPDATE (09/03/19, VRP): Since last visit, doing about the same. Some falls in last few weeks (bathroom). Less activity during pandemic. Now on carb/levo 1 tab 4x per day (every 4 hours).   UPDATE (08/27/18, VRP): Since last visit, doing poorly. Now living with family. Symptoms are progressive. More falls and syncope attacks. Severity is high. Tolerating meds. Using walker, but forget to use at times. Continues with peak dyskinesias.  UPDATE (02/19/18, VRP): Since last visit, doing poorly; multiple falls (4/3, 4/8, 4/12). Went to ER. Had small right frontal SAH on CT head. Followup CT stable. Tolerating CARB/LEVO. No alleviating or aggravating factors.   UPDATE (11/27/17, VRP): Since last visit, doing well. Tolerating meds. Now on carb/levo 25/100 (9 tabs divided over 4 times per day). No alleviating or aggravating factors. Has had more falls since last visit, but these occurred when she was note using cane or walker, and she was "doing things I am not supposed to do" such as cleaning up in the basement, bending and lifting items etc. When she uses cane and rollator, she does well. Now grand-daughter [name] and her friends (a married couple) have moved in with patient and great-grand daughter, and this arrangement is helping a lot.   UPDATE 05/23/17: Since last visit, had a difficult May 2018; had multiple falls on to the cement and on to the coffee  table. Was not using rollator at that time. More stress in Apr and May 2018. Went to ER, and CT head negative. Now feeling better. Now using the rollator more and not having any falls. Tolerating carb/levo. No wearing off.  UPDATE 11/21/16: Since last visit, pt is stable with PD. She has had some falls, but no major injuries. Using a cane and her scooter. Sometimes uses walker. Also unfortunately pt's husband passed away in 2016/11/26 from heart attack (age 33 yrs old). Pt living at home with grand-daughter and son-in-law. More fatigue in early AM. Some recent positional vertigo when getting up in the AM. No nausea or vomiting.   UPDATE 05/15/16: Since last visit, doing fair; some wearing off after 7:30am dose --> by 10:30-11 feels wearing off. Afternoon is better. Sleep and gait otherwise stable.   UPDATE 11/17/15: Since last visit, doing well on carb/levo 1.5 tabs QID. Overall doing well. 2-3 wks ago, stood up quickly, then fell backwards and thinks she passed out. Her family was there and caught her from falling to ground. She regained consciousness quickly. No major injuries or post ictal confusion.   UPDATE 12/07/14 (VRP): Since last visit, she feel stable from PD standpoint. No wearing off. Some nightmares and restless legs at night. Still with stress related to family issues. Now only husband and great-grand-daughter are at home.   UPDATE 09/04/14 (VRP): Since last visit, feels better. Taking carb/levo (1.5/1.5/1/1) and doing better. Still with tremor, slowness, stiffness. Still with balance diff. No falls. Uses a walker at times. Her husband  had a "light stroke" in July 2015, with good recovery. He does most of driving. Patient still drives a little (daytime). Patient's grand-daughter (and her boyfriend) and great-grand-daughter live with them. Patient has daughter and another granddaughter who could help her in the future if her health declines.   UPDATE 03/02/14 (VRP): Since last visit, she notes that  her PD symptoms are more significant in AM (with dose 1 and 2) but sig improve with doses 3 and 4. Now having intermittent dystonic posturing of left foot (previously only in right foot). Some restless legs at night. No falls. Uses a scooter or shopping cart for longer distances. Able to get around at home holding on to walls etc.   UPDATE 10/10/13 (LL):  Since last visit, she started increasing Sinemet from TID to QID, with good results.  Sometimes she is so busy she forgets to take extra dose.  Dystonia is the same in right foot, but she would rather hold off on starting another medication until she has to.  She has custody of her great-granddaughter who is 7 and has ADHD.  UPDATE 07/10/13 (VP): Since last visit, patient is noted some wearing off between dosages. She also is noted dystonia in the right foot. This has been happening over the past 2-3 months. She also has had increasing falls and balance problems. Patient is alone for this visit. She is not using a walker or cane.   UPDATE 05/29/12: Doing well. Taking carb/levo TID. No wearing off. No dyskinesias. She is happy with current dosing. PT has helped. Uses a cane for distance walking.   UPDATE 12/22/11: Doing better on carb/levo. notices tremor is worse if she misses a dose. Still with balance diff, and has fallen down. Also found that ther gas fireplace was leaking carbon monoide into the home. Now replaced, and her fatigue is better.   PRIOR HPI: 86 year old right-handed female with history of diabetes, hypothyroidism, osteoporosis, here for evaluation of tremor. patient reports progressive right upper extremity tremor for the past 1-1/2 years. Tremor is mainly present at rest but sometimes when she holds a cup or utensils. Tremor worsens with stress. Patient also reports unsteady gait and balance, intermittent drooling, and constipation. No significant change in sense of taste or smell. No reports of sleep disturbance or hallucination. No family  history of Parkinson's disease.    REVIEW OF SYSTEMS: Full 14 system review of systems performed and negative except: as per HPI.      ALLERGIES: Allergies  Allergen Reactions  . Azilect [Rasagiline] Nausea And Vomiting  . Cephalexin Other (See Comments)    Gets UTI after taking  . Codeine Itching  . Crestor [Rosuvastatin Calcium] Other (See Comments)    Tired, confused, GI issues  . E-Mycin [Erythromycin Base] Nausea And Vomiting  . Elavil [Amitriptyline] Other (See Comments)  . Fosamax [Alendronate] Other (See Comments)    Body aches  . Influenza Virus Vaccine Split Other (See Comments)    Side effects  . Iodine Other (See Comments)    infection  . Lidocaine Other (See Comments)    Feels hot, turns red  . Neurontin [Gabapentin] Other (See Comments)    Depression  . Novocain [Procaine] Other (See Comments)  . Penicillins Hives  . Pravastatin Other (See Comments)    Bone and joint pain  . Sulfa Drugs Cross Reactors Hives  . Tramadol Other (See Comments)    Muscle pain  . Darvon [Propoxyphene] Rash  . Mercury Rash    HOME MEDICATIONS:  Outpatient Medications Prior to Visit  Medication Sig Dispense Refill  . Bioflavonoid Products (VITAMIN C) CHEW Chew by mouth 2 (two) times daily.    . carbidopa-levodopa (SINEMET IR) 25-100 MG tablet Take 1 tablet by mouth 4 (four) times daily. 360 tablet 4  . Cholecalciferol (VITAMIN D-3) 5000 units TABS Take 1 tablet by mouth 2 (two) times daily.    . Coenzyme Q10 (COQ-10 PO) Take 1 tablet by mouth daily.     Marland Kitchen denosumab (PROLIA) 60 MG/ML SOLN injection Inject 60 mg into the skin every 6 (six) months. Administer in upper arm, thigh, or abdomen    . DHA-EPA-Vitamin E (OMEGA-3 COMPLEX PO) Take 1 capsule by mouth daily.    . Flaxseed, Linseed, (FLAX SEED OIL PO) Take 1 tablet by mouth daily.    . fluticasone (FLONASE) 50 MCG/ACT nasal spray Place 2 sprays into both nostrils as needed.     . furosemide (LASIX) 20 MG tablet Take 1 tablet  by mouth daily.    Marland Kitchen levothyroxine (SYNTHROID, LEVOTHROID) 75 MCG tablet Take 75 mcg by mouth daily.    . Magnesium 250 MG TABS Take 250 mg by mouth daily.     . Melatonin 3 MG TABS Take 3 mg by mouth at bedtime.    . Multiple Vitamins-Minerals (HEALTHY EYES PO) Take 1 capsule by mouth 2 (two) times daily.    . Multiple Vitamins-Minerals (MULTI-VITAMIN GUMMIES PO) Take 2 tablets by mouth daily. gummie    . oxyCODONE (ROXICODONE) 5 MG immediate release tablet Take 0.5 tablets (2.5 mg total) by mouth every 6 (six) hours as needed for breakthrough pain. 8 tablet 0  . PARoxetine (PAXIL) 10 MG tablet Take 10 mg by mouth daily. One tablet daily     . Thiamine HCl (VITAMIN B-1 PO) Take 1 tablet by mouth daily.    . vitamin E (VITAMIN E) 200 UNIT capsule Take 400 Units by mouth daily.      No facility-administered medications prior to visit.    PAST MEDICAL HISTORY: Past Medical History:  Diagnosis Date  . Actinic keratosis   . Arthritis   . Basal cell carcinoma 06/26/2019   Left temple. Superficial. Tx: EDC  . Cancer (HCC)    forehead, basal cell  . Diabetes mellitus    diet controlled  . Dyspepsia   . Falls   . GERD (gastroesophageal reflux disease)   . H/O: rheumatic fever   . Hypercalcemia   . Hypercholesteremia   . Hypothyroidism   . Macular degeneration   . OA (osteoarthritis of spine)    , Back  . Osteoporosis   . Osteoporosis   . Pancreatitis   . Parkinson disease ([place])   . Peripheral edema   . PONV (postoperative nausea and vomiting)   . Squamous cell carcinoma of skin 01/23/2019   Right forearm. SCCis  . Thoracic compression fracture ([place])   . Vitamin D deficiency   . Wears glasses     PAST SURGICAL HISTORY: Past Surgical History:  Procedure Laterality Date  . ABDOMINAL HYSTERECTOMY    . APPENDECTOMY    . CARPAL TUNNEL RELEASE     both  . CATARACT EXTRACTION    . COLONOSCOPY    . EYE SURGERY     both cataracts  . FOOT SURGERY  2009   hammer toes  . HAND  SURGERY    . HEMORRHOID SURGERY    . MOHS SURGERY Right 09/2015, 2020   forehead, basal cell  . TONSILLECTOMY    .  TRIGGER FINGER RELEASE Right 04/30/2013   Procedure: RELEASE TRIGGER FINGER/A-1 PULLEY RIGHT RING FINGER;  Surgeon: Wynonia Sours, MD;  Location: [facility];  Service: Orthopedics;  Laterality: Right;    FAMILY HISTORY: Family History  Problem Relation Age of Onset  . Heart disease Mother   . Heart disease Father   . Heart disease Brother   . Heart disease Maternal Grandmother   . Heart disease Maternal Grandfather   . Stroke Maternal Grandfather   . Heart disease Paternal Grandmother   . Heart disease Paternal Grandfather   . Heart attack Paternal Grandfather     SOCIAL HISTORY: Social History   Socioeconomic History  . Marital status: Married    Spouse name: Barnabas Lister  . Number of children: 3  . Years of education: College  . Highest education level: Not on file  Occupational History  . Occupation: retired    Comment: n/a  Tobacco Use  . Smoking status: Never Smoker  . Smokeless tobacco: Never Used  Substance and Sexual Activity  . Alcohol use: No  . Drug use: No  . Sexual activity: Not on file  Other Topics Concern  . Not on file  Social History Narrative   09/03/19 Patient lives with dghtr, son in law   3 children, 1 deceased    Caffeine Use: 2-3 cups of coffee in the a.m. daily   Social Determinants of Health   Financial Resource Strain:   . Difficulty of Paying Living Expenses: Not on file  Food Insecurity:   . Worried About Charity fundraiser in the Last Year: Not on file  . Ran Out of Food in the Last Year: Not on file  Transportation Needs:   . Lack of Transportation (Medical): Not on file  . Lack of Transportation (Non-Medical): Not on file  Physical Activity:   . Days of Exercise per Week: Not on file  . Minutes of Exercise per Session: Not on file  Stress:   . Feeling of Stress : Not on file  Social Connections:   .  Frequency of Communication with Friends and Family: Not on file  . Frequency of Social Gatherings with Friends and Family: Not on file  . Attends Religious Services: Not on file  . Active Member of Clubs or Organizations: Not on file  . Attends Archivist Meetings: Not on file  . Marital Status: Not on file  Intimate Partner Violence:   . Fear of Current or Ex-Partner: Not on file  . Emotionally Abused: Not on file  . Physically Abused: Not on file  . Sexually Abused: Not on file     PHYSICAL EXAM  Vitals:   09/06/20 1319  BP: 101/61  Pulse: 78  Weight: 128 lb (58.1 kg)  Height: 5\' 4"  (1.626 m)   No data found.  Body mass index is 21.97 kg/m.  EXAM:  General: Patient is awake, alert and in no acute distress. Well developed and groomed. POSITIVE MYERSON'S. MASKED FACIES. Neck: Neck is supple.  Cardiovascular: No carotid artery bruits. Heart is regular rate and rhythm with no murmurs.   Neurologic Exam  Mental Status: Awake, alert. Language is fluent and comprehension intact.  Cranial Nerves: Pupils are equal and reactive to light. Visual fields are full to confrontation. Conjugate eye movements are full and symmetric. Facial sensation and strength are symmetric. Hearing is intact. Palate elevated symmetrically and uvula is midline. Shoulder shrug is symmetric. Tongue is midline. SOFT, HOARSE VOICE.  Motor:  MILD DYSKINESIAS IN UPPER AND LOWER EXT; COGWHEELING IN RUE > LUE. MODERATE BRADYKINESIA IN BUE AND BLE. DIFFUSE 4/5 strength in the upper and lower extremities.  Sensory: Intact and symmetric to light touch  Coordination: No ataxia or dysmetria on finger-nose or rapid alternating movement testing.  Reflexes: Deep tendon reflexes in the upper and lower extremity are present and symmetric. ABSENT AT ANKLES. Gait and Station: IN [employer]; LEANING TO RIGHT SIDE   DIAGNOSTIC DATA (LABS, IMAGING, TESTING) - I reviewed patient records, labs, notes, testing and  imaging myself where available.  Lab Results  Component Value Date   WBC 5.8 02/06/2018   HGB 12.2 02/06/2018   HCT 37.9 02/06/2018   MCV 93.8 02/06/2018   PLT 248 02/06/2018      Component Value Date/Time   NA 141 04/30/2013 0820   K 4.0 04/30/2013 0820   CL 99 04/30/2013 0820   CO2 30 09/07/2008 1600   GLUCOSE 122* 04/30/2013 0820   BUN 18 04/30/2013 0820   CREATININE 0.70 04/30/2013 0820   CALCIUM 9.8 09/07/2008 1600   GFRNONAA >60 09/07/2008 1600   GFRAA  Value: >60   09/07/2008 1600    10/26/11 MRI brain - mild bitemporal atrophy   02/06/18 CT HEAD: 1. Acute small volume RIGHT frontal subarachnoid hemorrhage. 2. Moderate RIGHT frontal scalp hematoma.  No skull fracture. 3. Stable moderate parenchymal brain volume loss and mild chronic small vessel ischemic disease.  02/06/18 CT ORBITS: 1. RIGHT periorbital soft tissue swelling/contusion without postseptal involvement.  02/06/18 CT CERVICAL SPINE:  1. No acute fracture or malalignment. 2. Moderate to severe C4-5 and C5-6 neural foraminal narrowing.  02/15/18 CT head  - Small volume subarachnoid hemorrhage over the RIGHT frontal convexity is improved compared with 02/06/2018, but still visible. No new areas of hemorrhage are observed. - Stable atrophy and small vessel disease.  No skull fracture.   ASSESSMENT AND PLAN 86 y.o. female with diabetes, hypothyroidism, osteoporosis, with progressive right upper extremity resting tremor. She has cogwheel rigidity, bradykinesia, stooped posture, and Myerson's sign on exam. Continued progression of disease. Had orthostatic hypotension event / syncope in Jan 2017, due to rapidly standing. Has significant gait difficulty.   Meds tried: carb/levo, rasagiline (nausea)  Dx: idiopathic Parkinson's disease   Parkinson's disease ([place])  Gait difficulty   PLAN:   PARKINSON'S DISEASE (established problem, worsening) - continue carbidopa-levodopa dosing (2 tabs three times a day) -  use walker and wheelchair  NEUROGENIC ORTHOSTATIC HYPOTENSION - use salt, compression stockings, elevate head of bed - monitor BP at home - consider florinef, midodrine, droxidopa - consider follow up with cardiology  Meds ordered this encounter  Medications  . carbidopa-levodopa (SINEMET IR) 25-100 MG tablet    Sig: Take 2 tablets by mouth 3 (three) times daily.    Dispense:  540 tablet    Refill:  4   Return in about 1 year (around 09/06/2021).    Penni Bombard, MD 69/02/8545, 2:70 PM Certified in Neurology, Neurophysiology and Neuroimaging  [facility] [address] [place], [place] [address] [phone]

## 2020-10-21 ENCOUNTER — Ambulatory Visit: Payer: Medicare Other | Admitting: Dermatology

## 2020-10-21 ENCOUNTER — Other Ambulatory Visit: Payer: Self-pay

## 2020-10-21 DIAGNOSIS — Z85828 Personal history of other malignant neoplasm of skin: Secondary | ICD-10-CM | POA: Diagnosis not present

## 2020-10-21 DIAGNOSIS — C44612 Basal cell carcinoma of skin of right upper limb, including shoulder: Secondary | ICD-10-CM | POA: Diagnosis not present

## 2020-10-21 DIAGNOSIS — L570 Actinic keratosis: Secondary | ICD-10-CM | POA: Diagnosis not present

## 2020-10-21 DIAGNOSIS — D489 Neoplasm of uncertain behavior, unspecified: Secondary | ICD-10-CM

## 2020-10-21 DIAGNOSIS — D229 Melanocytic nevi, unspecified: Secondary | ICD-10-CM

## 2020-10-21 DIAGNOSIS — L821 Other seborrheic keratosis: Secondary | ICD-10-CM

## 2020-10-21 DIAGNOSIS — L578 Other skin changes due to chronic exposure to nonionizing radiation: Secondary | ICD-10-CM

## 2020-10-21 DIAGNOSIS — L89621 Pressure ulcer of left heel, stage 1: Secondary | ICD-10-CM | POA: Diagnosis not present

## 2020-10-21 DIAGNOSIS — L814 Other melanin hyperpigmentation: Secondary | ICD-10-CM

## 2020-10-21 DIAGNOSIS — Z1283 Encounter for screening for malignant neoplasm of skin: Secondary | ICD-10-CM

## 2020-10-21 DIAGNOSIS — D1800 Hemangioma unspecified site: Secondary | ICD-10-CM

## 2020-10-21 NOTE — Patient Instructions (Addendum)
Wound Care Instructions  1. Cleanse wound gently with soap and water once a day then pat dry with clean gauze. Apply a thing coat of Petrolatum (petroleum jelly, "Vaseline") over the wound (unless you have an allergy to this). We recommend that you use a new, sterile tube of Vaseline. Do not pick or remove scabs. Do not remove the yellow or white "healing tissue" from the base of the wound.  2. Cover the wound with fresh, clean, nonstick gauze and secure with paper tape. You may use Band-Aids in place of gauze and tape if the would is small enough, but would recommend trimming much of the tape off as there is often too much. Sometimes Band-Aids can irritate the skin.  3. You should call the office for your biopsy report after 1 week if you have not already been contacted.  4. If you experience any problems, such as abnormal amounts of bleeding, swelling, significant bruising, significant pain, or evidence of infection, please call the office immediately.  5. FOR ADULT SURGERY PATIENTS: If you need something for pain relief you may take 1 extra strength Tylenol (acetaminophen) AND 2 Ibuprofen (200mg  each) together every 4 hours as needed for pain. (do not take these if you are allergic to them or if you have a reason you should not take them.) Typically, you may only need pain medication for 1 to 3 days.    Cryotherapy Aftercare  . Wash gently with soap and water everyday.   Marland Kitchen Apply Vaseline and Band-Aid daily until healed   Melanoma ABCDEs  Melanoma is the most dangerous type of skin cancer, and is the leading cause of death from skin disease.  You are more likely to develop melanoma if you:  Have light-colored skin, light-colored eyes, or red or blond hair  Spend a lot of time in the sun  Tan regularly, either outdoors or in a tanning bed  Have had blistering sunburns, especially during childhood  Have a close family member who has had a melanoma  Have atypical moles or large  birthmarks  Early detection of melanoma is key since treatment is typically straightforward and cure rates are extremely high if we catch it early.   The first sign of melanoma is often a change in a mole or a new dark spot.  The ABCDE system is a way of remembering the signs of melanoma.  A for asymmetry:  The two halves do not match. B for border:  The edges of the growth are irregular. C for color:  A mixture of colors are present instead of an even brown color. D for diameter:  Melanomas are usually (but not always) greater than 70mm - the size of a pencil eraser. E for evolution:  The spot keeps changing in size, shape, and color.  Please check your skin once per month between visits. You can use a small mirror in front and a large mirror behind you to keep an eye on the back side or your body.   If you see any new or changing lesions before your next follow-up, please call to schedule a visit.  Please continue daily skin protection including broad spectrum sunscreen SPF 30+ to sun-exposed areas, reapplying every 2 hours as needed when you're outdoors.

## 2020-10-21 NOTE — Progress Notes (Signed)
Follow-Up Visit   Subjective  Sarah Callahan is a 86 y.o. female who presents for the following: TBSE  (6 month total body self exam . Patient's daughter and husband reports no concerns at this time. Patient has history bcc on left temple and some Ak on left forearm x 2 and right shin x 2. ).  The following portions of the chart were reviewed this encounter and updated as appropriate:  Tobacco  Allergies  Meds  Problems  Med Hx  Surg Hx  Fam Hx      Objective  Well appearing patient in no apparent distress; mood and affect are within normal limits.  A full examination was performed including scalp, head, eyes, ears, nose, lips, neck, chest, axillae, abdomen, back, buttocks, bilateral upper extremities, bilateral lower extremities, hands, feet, fingers, toes, fingernails, and toenails. All findings within normal limits unless otherwise noted below.  Objective  Right Shoulder: 0.3 cm pink papule with pigmented globules       Objective  left shin: Erythematous thin papules/macules with gritty scale.   Objective  Left Foot - Anterior: Erythematous patch   Assessment & Plan    History of Basal Cell Carcinoma of the Skin - No evidence of recurrence today - Recommend regular full body skin exams - Recommend daily broad spectrum sunscreen SPF 30+ to sun-exposed areas, reapply every 2 hours as needed.  - Call if any new or changing lesions are noted between office visits  Lentigines - Scattered tan macules - Discussed due to sun exposure - Benign, observe - Call for any changes  Seborrheic Keratoses - Stuck-on, waxy, tan-brown papules and plaques  - Discussed benign etiology and prognosis. - Observe - Call for any changes  Melanocytic Nevi - Tan-brown and/or pink-flesh-colored symmetric macules and papules - Benign appearing on exam today - Observation - Call clinic for new or changing moles - Recommend daily use of broad spectrum spf 30+ sunscreen to  sun-exposed areas.   Hemangiomas - Red papules - Discussed benign nature - Observe - Call for any changes  Actinic Damage - Chronic, secondary to cumulative UV/sun exposure - diffuse scaly erythematous macules with underlying dyspigmentation - Recommend daily broad spectrum sunscreen SPF 30+ to sun-exposed areas, reapply every 2 hours as needed.  - Call for new or changing lesions.  Skin cancer screening performed today.  Neoplasm of uncertain behavior Right Shoulder  Epidermal / dermal shaving  Informed consent: discussed and consent obtained   Timeout: patient name, date of birth, surgical site, and procedure verified   Patient was prepped and draped in usual sterile fashion: area prepped with isopropyl alcohol. Anesthesia: the lesion was anesthetized in a standard fashion   Anesthetic:  1% lidocaine w/ epinephrine 1-100,000 buffered w/ 8.4% NaHCO3 Instrument used: flexible razor blade   Hemostasis achieved with: aluminum chloride   Outcome: patient tolerated procedure well   Post-procedure details: wound care instructions given   Additional details:  Mupirocin and a bandage applied  Specimen 1 - Surgical pathology Differential Diagnosis: r/o pigmented bcc  Check Margins: Yes 0.3 cm pink papule with pigment globules     Actinic keratosis left shin  Prior to procedure, discussed risks of blister formation, small wound, skin dyspigmentation, or rare scar following cryotherapy.    Destruction of lesion - left shin Complexity: simple   Destruction method: cryotherapy   Informed consent: discussed and consent obtained   Lesion destroyed using liquid nitrogen: Yes   Region frozen until ice ball extended beyond lesion: Yes  Outcome: patient tolerated procedure well with no complications   Post-procedure details: wound care instructions given    Pressure ulcer of left heel, stage 1 Left Foot - Anterior  continue off loading pressure with repositioning and  pillows. Can also add zinc oxide barrier paste to help protect skin.  Return in about 6 months (around 04/21/2021) for ak followup .  I, Ruthell Rummage, CMA, am acting as scribe for Forest Gleason, MD.  Documentation: I have reviewed the above documentation for accuracy and completeness, and I agree with the above.  Forest Gleason, MD

## 2020-10-26 NOTE — Progress Notes (Signed)
Skin , right shoulder BASAL CELL CARCINOMA, PIGMENTED AND MELANOCYTIC NEVUS, INTRADERMAL TYPE --> if margins clear, will monitor. If positive margins, will do ED&C.  MAs please call [facility] pathology and ask them to check margins and please update patient with results. Thank you!

## 2020-10-27 ENCOUNTER — Encounter: Payer: Self-pay | Admitting: Dermatology

## 2020-11-02 ENCOUNTER — Telehealth: Payer: Self-pay

## 2020-11-02 NOTE — Telephone Encounter (Signed)
Patient's daughter advised bx showed BCC. Called [place] to have margins checked. If clear will monitor, if + will schedule for [facility]. Pt's daughter agrees with plan, JS

## 2020-11-02 NOTE — Telephone Encounter (Signed)
-----   Message from Sandi Mealy, MD sent at 10/26/2020  5:01 PM EST ----- Skin , right shoulder BASAL CELL CARCINOMA, PIGMENTED AND MELANOCYTIC NEVUS, INTRADERMAL TYPE --> if margins clear, will monitor. If positive margins, will do ED&C.  MAs please call [facility] pathology and ask them to check margins and please update patient with results. Thank you!

## 2020-11-09 ENCOUNTER — Telehealth: Payer: Self-pay

## 2020-11-09 NOTE — Telephone Encounter (Signed)
Patient's daughter and son-in-law advised margins appear to be clear, can monitor or do EDC. They prefer to monitor and will call if they notice any changes. Patient is scheduled for follow up June 2022, JS

## 2020-11-09 NOTE — Telephone Encounter (Signed)
-----   Message from [place], MD sent at 11/03/2020  9:49 PM EST ----- Update from pathology says the basal cell skin cancer extends close to the edge but doesn't go all the way to the edge from what they can see. It would be reasonable to recheck on follow-up and monitor or, if they prefer, we can bring her in to do an [facility] to lower the chance of this coming back.   MAs please call patient. If family prefers ED&C to lower risk of it coming back, please schedule. Otherwise I'll see her back at her regular follow-up and we will check this site then. Thank you!

## 2021-03-01 ENCOUNTER — Telehealth: Payer: Self-pay

## 2021-03-01 NOTE — Telephone Encounter (Signed)
Spoke with patient's daughter [name] and scheduled an in-person Palliative Consult for 03/25/21 @ 12:30PM  COVID screening was negative. No pets in home. Patient lives with daughter.  Consent obtained; updated Outlook/Netsmart/Team List and Epic.  Family is aware they may be receiving a call from NP the day before or day of to confirm appointment.

## 2021-03-25 ENCOUNTER — Other Ambulatory Visit: Payer: Self-pay

## 2021-03-25 ENCOUNTER — Other Ambulatory Visit: Payer: Medicare Other | Admitting: Nurse Practitioner

## 2021-03-25 VITALS — BP 98/50 | HR 81 | Resp 16 | Ht 65.0 in

## 2021-03-25 DIAGNOSIS — R52 Pain, unspecified: Secondary | ICD-10-CM

## 2021-03-25 DIAGNOSIS — R531 Weakness: Secondary | ICD-10-CM

## 2021-03-25 DIAGNOSIS — K5900 Constipation, unspecified: Secondary | ICD-10-CM

## 2021-03-25 DIAGNOSIS — Z515 Encounter for palliative care: Secondary | ICD-10-CM

## 2021-03-25 NOTE — Progress Notes (Signed)
[place] Consult Note Telephone: [phone]  Fax: [phone]    Date of encounter: 03/25/21 PATIENT NAME: Sarah Callahan [place] [address]   [phone] (home)  DOB: 19-Sep-1931 MRN: [id number]  PRIMARY CARE PROVIDER:    Harlan Stains, MD,  [address] [place] [address] [phone]  REFERRING PROVIDER:   Harlan Stains, MD [address] [place],  [place] [address] [phone]  RESPONSIBLE PARTY:    Contact Information    Name Relation Home Work Mobile   Dessa Phi Spouse [phone]     [facility] Daughter [phone]  [phone]   Dajahnae, Vondra Daughter [phone]       I met face to face with patient in home. Daughter [name] and her husband present during visit. Palliative Care was asked to follow this patient by consultation request of  Harlan Stains, MD to address advance care planning and complex medical decision making. This is the initial visit.                                   ASSESSMENT AND PLAN / RECOMMENDATIONS:   Advance Care Planning/Goals of Care: Goals include to maximize quality of life and symptom management.  Visit consisted of building trust and discussions on Palliative care medicine as specialized medical care for people living with serious illness, aimed at facilitating improved quality of life through symptoms relief, assisting with advance care planning and establishing goals of care. Family expressed appreciation for education provided on Palliative care and how it differs from Hospice service. Our advance care planning conversation included a discussion about:     The value and importance of advance care planning   Experiences with loved ones who have been seriously ill or have died   Exploration of personal, cultural or spiritual beliefs that might influence medical decisions   Exploration of goals of care in the  event of a sudden injury or illness   Review and updating or creation of an  advance directive document .  Decision not to resuscitate or to de-escalate disease focused treatments due to poor prognosis.  CODE STATUS: DNR Goal of care: Patient's goal of care is comfort. She verbalized desire to end her days at home, does not want hospitalization. Directives: Patient verbalized desire to not be resuscitated in the event of cardiac or respiratory arrest. Validation provided. DNR form signed for patient today, family advised to keep form in a readily accessible area in home.  The need to complete a MOST form was discussed, patient and daughter expressed interest in completing a MOST form today. Discussed and reviewed sections of the form in detail, opportunity for questions given, all questions answered. Form completed and signed with daughter, signed form given to daughter to keep, copy of MOST and DNR form uploaded to [facility] EMR.  Palliative care will continue to provide support to patient, family and medical team.  I spent 50 minutes providing this consultation. More than 50% of the time in this consultation was spent in counseling and care coordination. ------------------------------------------------------------------------------  Symptom Management/Plan: Generalized weakness: Family report patient needing more assistance with her ADLs. Patient lives with her dauhter [name]. Family asked if palliative care is able to provide personal care providers. Family made aware that palliative care does not provide personal care services but could provided list of agencies that provides the services at  the expense of the patient. Patient and her family verbalized understanding.  Encouraged supportive care, maintain safety. Discussed fall preventions strategies to include standby assistance with all ADLs. Continue plan of care for Parkinson's disease, follow up with neurology as  scheduled. Constipation: Patient expressed concern for diarrhea when she takes medication routinely for constipation. Encourage diet rich in fruits and vegetables, encourage adequate oral fluid intake. May consider taking Senokot 8.33m daily if constipation persist, if no bowel movement in more than 3 days. Pain: pain related to arthritis controlled on PRN Tylenol ER 6594mas needed. Continue current regimen.  Follow up Palliative Care Visit: Palliative care will continue to follow for complex medical decision making, advance care planning, and clarification of goals. Return in about 4 weeks or prn.  PPS: 40% weak  HOSPICE ELIGIBILITY/DIAGNOSIS: TBD  Chief Complaint: generalized weakness  History obtained from review of Epic EMR and discussion with Ms. ZiFloridond her daughter.  HISTORY OF PRESENT ILLNESS:  Sarah Callahan a [age].o. year old female with Parkinson's disease, type 2 diabetes, osteoporosis, Macular degeneration, GERD, hx of skin cancer followed by dermatology.  Patient with complain of generalized weakness in the context of progression of parkinson's disease. Condition is associated with frequent falls. Family report occasional muscle rigidity related to PD. Patient ambulates with a front wheel walker with standby assist. She endorsed feeling well overall. Report ongoing constipation relieved with PRN over the counter Bisacodyl, report having bowel movements 1-2 times a week. She denied abdominal pain,denied bloating, denied rectal bleed, denied nausea or vomiting.   I reviewed available labs, medications, imaging, studies and related documents from the EMR.  Records reviewed and summarized above.   ROS EYES: denies acute vision changes ENMT: denies dysphagia Cardiovascular: denies chest pain, denies DOE Pulmonary: denies cough, denies increased SOB Abdomen: endorses good appetite, endorsed constipation, endorses continence of bowel GU: denies dysuria, endorses incontinence  of urine MSK:  endorsed weakness, falls reported Skin: denies rashes or wounds Neurological: denies uncontrolled pain, denies insomnia Psych: Endorses positive mood Heme/lymph/immuno: denies bruises, abnormal bleeding  Physical Exam: Current and past weights: no weight loss reported, no current weight reported General: frail appearing, thin, NAD, sitting in chair actively participate in discussion  EYES: anicteric sclera, no discharge  ENMT: intact hearing, oral mucous membranes moist CV: S1S2 normal, no LE edema Pulmonary: LCTA, no increased work of breathing, no cough, room air Abdomen: no ascites GU: deferred MSK: severe sarcopenia, moves all extremities, ambulatory with standby assist Skin: warm and dry, no rashes or wounds on visible skin Neuro: generalized weakness, no cognitive impairment Psych: non-anxious affect, A and O x 3 Hem/lymph/immuno: no widespread bruising  CURRENT PROBLEM LIST:  Patient Active Problem List   Diagnosis Date Noted  . Parkinson's disease ([facility]04/27/2015  . Dyspnea on exertion 12/16/2013  . Type 2 diabetes mellitus with peripheral neuropathy ([facility]02/08/2014  . Hyperlipidemia 12/16/2013  . Varicose veins of lower extremities with other complications 0440/37/0964. Swelling of limb 02/05/2012   PAST MEDICAL HISTORY:  Active Ambulatory Problems    Diagnosis Date Noted  . Varicose veins of lower extremities with other complications 0438/38/1840. Swelling of limb 02/05/2012  . Dyspnea on exertion 12/16/2013  . Type 2 diabetes mellitus with peripheral neuropathy ([facility]02/08/2014  . Hyperlipidemia 12/16/2013  . Parkinson's disease ([facility]04/27/2015   Resolved Ambulatory Problems    Diagnosis Date Noted  . Parkinson disease ([facility]12/03/2013   Past Medical History:  Diagnosis Date  .  Actinic keratosis   . Arthritis   . Basal cell carcinoma 06/26/2019  . Basal cell carcinoma 10/21/2020  . Cancer ([place])   . Diabetes mellitus   . Dyspepsia   .  Falls   . GERD (gastroesophageal reflux disease)   . H/O: rheumatic fever   . Hypercalcemia   . Hypercholesteremia   . Hypothyroidism   . Macular degeneration   . OA (osteoarthritis of spine)   . Osteoporosis   . Osteoporosis   . Pancreatitis   . Peripheral edema   . PONV (postoperative nausea and vomiting)   . Squamous cell carcinoma of skin 01/23/2019  . Thoracic compression fracture ([place])   . Vitamin D deficiency   . Wears glasses    SOCIAL HX:  Social History   Tobacco Use  . Smoking status: Never Smoker  . Smokeless tobacco: Never Used  Substance Use Topics  . Alcohol use: No   FAMILY HX:  Family History  Problem Relation Age of Onset  . Heart disease Mother   . Heart disease Father   . Heart disease Brother   . Heart disease Maternal Grandmother   . Heart disease Maternal Grandfather   . Stroke Maternal Grandfather   . Heart disease Paternal Grandmother   . Heart disease Paternal Grandfather   . Heart attack Paternal Grandfather      ALLERGIES:  Allergies  Allergen Reactions  . Azilect [Rasagiline] Nausea And Vomiting  . Cephalexin Other (See Comments)    Gets UTI after taking  . Codeine Itching  . Crestor [Rosuvastatin Calcium] Other (See Comments)    Tired, confused, GI issues  . E-Mycin [Erythromycin Base] Nausea And Vomiting  . Elavil [Amitriptyline] Other (See Comments)  . Fosamax [Alendronate] Other (See Comments)    Body aches  . Influenza Virus Vaccine Split Other (See Comments)    Side effects  . Iodine Other (See Comments)    infection  . Lidocaine Other (See Comments)    Feels hot, turns red  . Neurontin [Gabapentin] Other (See Comments)    Depression  . Novocain [Procaine] Other (See Comments)  . Penicillins Hives  . Pravastatin Other (See Comments)    Bone and joint pain  . Sulfa Drugs Cross Reactors Hives  . Tramadol Other (See Comments)    Muscle pain  . Darvon [Propoxyphene] Rash  . Mercury Rash     PERTINENT MEDICATIONS:   Outpatient Encounter Medications as of 03/25/2021  Medication Sig  . Bioflavonoid Products (VITAMIN C) CHEW Chew by mouth 2 (two) times daily.  . carbidopa-levodopa (SINEMET IR) 25-100 MG tablet Take 2 tablets by mouth 3 (three) times daily.  . Cholecalciferol (VITAMIN D-3) 5000 units TABS Take 1 tablet by mouth 2 (two) times daily.  . Coenzyme Q10 (COQ-10 PO) Take 1 tablet by mouth daily.   Marland Kitchen denosumab (PROLIA) 60 MG/ML SOLN injection Inject 60 mg into the skin every 6 (six) months. Administer in upper arm, thigh, or abdomen  . DHA-EPA-Vitamin E (OMEGA-3 COMPLEX PO) Take 1 capsule by mouth daily.  . Flaxseed, Linseed, (FLAX SEED OIL PO) Take 1 tablet by mouth daily.  . fluticasone (FLONASE) 50 MCG/ACT nasal spray Place 2 sprays into both nostrils as needed.   . furosemide (LASIX) 20 MG tablet Take 1 tablet by mouth daily.  Marland Kitchen levothyroxine (SYNTHROID, LEVOTHROID) 75 MCG tablet Take 75 mcg by mouth daily.  . Magnesium 250 MG TABS Take 250 mg by mouth daily.   . Melatonin 3 MG TABS Take 3  mg by mouth at bedtime.  . Multiple Vitamins-Minerals (HEALTHY EYES PO) Take 1 capsule by mouth 2 (two) times daily.  . Multiple Vitamins-Minerals (MULTI-VITAMIN GUMMIES PO) Take 2 tablets by mouth daily. gummie  . oxyCODONE (ROXICODONE) 5 MG immediate release tablet Take 0.5 tablets (2.5 mg total) by mouth every 6 (six) hours as needed for breakthrough pain.  Marland Kitchen PARoxetine (PAXIL) 10 MG tablet Take 10 mg by mouth daily. One tablet daily  . Thiamine HCl (VITAMIN B-1 PO) Take 1 tablet by mouth daily.  . vitamin E (VITAMIN E) 200 UNIT capsule Take 400 Units by mouth daily.    No facility-administered encounter medications on file as of 03/25/2021.   Thank you for the opportunity to participate in the care of Ms. Wilbourne.  The palliative care team will continue to follow. Please call our office at [phone] if we can be of additional assistance.   Jari Favre, DNP, AGPCNP-BC  COVID-19 PATIENT  SCREENING TOOL Asked and negative response unless otherwise noted:   Have you had symptoms of covid, tested positive or been in contact with someone with symptoms/positive test in the past 5-10 days?

## 2021-04-21 ENCOUNTER — Ambulatory Visit: Payer: Medicare Other | Admitting: Dermatology

## 2021-05-04 ENCOUNTER — Other Ambulatory Visit: Payer: Medicare Other | Admitting: Nurse Practitioner

## 2021-05-04 ENCOUNTER — Other Ambulatory Visit: Payer: Self-pay

## 2021-05-04 VITALS — BP 132/80 | HR 86 | Resp 18

## 2021-05-04 DIAGNOSIS — G20 Parkinson's disease: Secondary | ICD-10-CM

## 2021-05-04 DIAGNOSIS — K5901 Slow transit constipation: Secondary | ICD-10-CM

## 2021-05-04 DIAGNOSIS — Z515 Encounter for palliative care: Secondary | ICD-10-CM

## 2021-05-04 DIAGNOSIS — M810 Age-related osteoporosis without current pathological fracture: Secondary | ICD-10-CM

## 2021-05-04 NOTE — Progress Notes (Signed)
  [facility] Consult Note Telephone: [phone]  Fax: [phone]    Date of encounter: 05/04/21 PATIENT NAME: Sarah Callahan [address] [place] [address]   [phone] (home)  DOB: 09/05/1931 MRN: [id number]  PRIMARY CARE PROVIDER:    White, Cynthia, MD,  [address] [place] [address] [phone]  REFERRING PROVIDER:   White, Cynthia, MD [address] [place],  [place] [address] [phone]  RESPONSIBLE PARTY:    Contact Information     Name Relation Home Work Mobile   Callahan, Sarah Spouse [phone]     Callahan,Sarah Daughter [phone]  [phone]   Callahan,Sarah Daughter [phone]       I met face to face with patient in home. Patient daughter and her daughter's husband present during visit. Palliative Care was asked to follow this patient by consultation request of  Callahan, Cynthia, MD to address advance care planning and complex medical decision making. This is a follow up visit.                                ASSESSMENT AND PLAN / RECOMMENDATIONS:   Advance Care Planning/Goals of Care:  CODE STATUS: DNR Goal of care: Patient's goal of care is comfort. Directives: Signed DNR and MOST forms present in home, copy on [facility] Epic EMR. Details of MOST form include comfort measures, determine use or limitation of antibiotics, IV fluids for defined trial period, no feeding tube.  Symptom Management/Plan: Tremors: Daughter report tremors recently improved on staggering administration of Sinemet 25-100 one tablet 4 times a daily. Continue current plan of care. Continue supportive care, assist with ADLs as indicated while allowing patient to do as much as possible for self.  Constipation: Continue Bisacodyl. Advised to start Miralax 17g daily, may consider every other day dosing if concerns for diarrhea. Encouraged adequate oral fluid intake for  hydration. Osteoporosis: stable without report of pathological fractures. Continue routine Prolia injections. Maintain safety. Discussed fall precautions to include consistent use of assistive device during ambulation. Continue Tylenol 500mg twice a day for pain. Provided General support and encouragement. Questions and concerns were addressed. Patient and her family was encouraged to call with questions and/or concerns.   Follow up Palliative Care Visit: Palliative care will continue to follow for complex medical decision making, advance care planning, and clarification of goals. Return in about 6-8 weeks or prn.  PPS: 40%  HOSPICE ELIGIBILITY/DIAGNOSIS: TBD  CHIEF COMPLAIN: worsening tremors  History obtained from review of Epic EMR and discussion with Sarah Callahan and her daughter.  HISTORY OF PRESENT ILLNESS:  Sarah Callahan is a 87 y.o. year old female with  Parkinson's disease, type 2 diabetes, osteoporosis, Macular degeneration, GERD, hx of skin cancer followed by dermatology. Patient with report of increased tremors in the context of progression of Parkinson's diease. Tremors is associated with occasional difficulty feeding self. Condition worse in the mornings and relieved with administration of Sinemet. Patient report tremor does not interfere with her sleep.  Patient report constipation, report having bowel movements once a week, report some rectal bleed due to hemorrhoids and straining. Patient currently takes over the counter Bisacodyl  2 tablets daily. She denied any uncontrolled pain, denied fever, denied chills, denied SOB. Ten systems reviewed and are negative for acute change, except as noted in the HPI.   I reviewed available labs, medications, imaging, studies and related   documents from the EMR.  Records reviewed and summarized above.   Vitals:   05/04/21 1138  BP: 132/80  Pulse: 86  Resp: 18  SpO2: 94%    Physical Exam: General: frail appearing, thin, sitting in a  chair in NAD  EYES: anicteric sclera, no discharge ENMT: intact hearing, oral mucous membranes moist CV: RRR normal, no LE edema Pulmonary: LCTA, no increased work of breathing, no cough, room air Abdomen: no ascites GU: deferred MSK: severe sarcopenia, moves all extremities, ambulatory with standby assist Skin: warm and dry, no rashes or wounds on visible skin Neuro: generalized weakness, no cognitive impairment Psych: non-anxious affect, A and O x 3 Hem/lymph/immuno: no widespread bruising  Past Medical History:  Diagnosis Date   Actinic keratosis    Arthritis    Basal cell carcinoma 06/26/2019   Left temple. Superficial. Tx: EDC   Basal cell carcinoma 10/21/2020   R shoulder - observation    Cancer (HCC)    forehead, basal cell   Diabetes mellitus    diet controlled   Dyspepsia    Falls    GERD (gastroesophageal reflux disease)    H/O: rheumatic fever    Hypercalcemia    Hypercholesteremia    Hypothyroidism    Macular degeneration    OA (osteoarthritis of spine)    , Back   Osteoporosis    Osteoporosis    Pancreatitis    Parkinson disease (HCC)    Peripheral edema    PONV (postoperative nausea and vomiting)    Squamous cell carcinoma of skin 01/23/2019   Right forearm. SCCis   Thoracic compression fracture (HCC)    Vitamin D deficiency    Wears glasses    Thank you for the opportunity to participate in the care of Sarah Callahan.  The palliative care team will continue to follow. Please call our office at [phone] if we can be of additional assistance.    , DNP, AGPCNP-BC  COVID-19 PATIENT SCREENING TOOL Asked and negative response unless otherwise noted:   Have you had symptoms of covid, tested positive or been in contact with someone with symptoms/positive test in the past 5-10 days?   

## 2021-07-04 ENCOUNTER — Telehealth: Payer: Self-pay | Admitting: Diagnostic Neuroimaging

## 2021-07-04 NOTE — Telephone Encounter (Signed)
Called daughter who stated mother has had decline, and they have recently tried PT/OT without progress. Advised we'll schedule FU; daughter stated they cannot do VV, but would do telephone visit. Scheduled for soonest and put her on wait list. Daughter verbalized understanding, appreciation. Of note patient is taking carb/levo one tab three x daily; daughter stated if she takes two tabs she "shakes a lot and it wears her out".

## 2021-07-04 NOTE — Telephone Encounter (Signed)
Pt's daughter is asking for a call to discuss that pt is no longer walking and pt's hands are numb and has to be fed in order to eat.  Please call daughter to discuss.

## 2021-07-15 ENCOUNTER — Other Ambulatory Visit: Payer: Self-pay

## 2021-07-15 ENCOUNTER — Other Ambulatory Visit: Payer: Medicare Other | Admitting: Nurse Practitioner

## 2021-07-15 DIAGNOSIS — Z515 Encounter for palliative care: Secondary | ICD-10-CM

## 2021-07-15 DIAGNOSIS — K5909 Other constipation: Secondary | ICD-10-CM

## 2021-07-15 DIAGNOSIS — G20 Parkinson's disease: Secondary | ICD-10-CM

## 2021-07-15 NOTE — Progress Notes (Signed)
[facility] Consult Note Telephone: [phone]  Fax: [phone]    Date of encounter: 07/15/21 3:03 PM PATIENT NAME: Sarah Callahan [place] [address]   [phone] (home)  DOB: [birth year] MRN: [id number] PRIMARY CARE PROVIDER:    Harlan Stains, MD,  [address] [place] [address] [phone]  RESPONSIBLE PARTY:    Contact Information     Name Relation Home Work Mobile   Dessa Phi Spouse [id number]     [place] Daughter [phone]  [phone]   [place], Tudor Daughter [phone]        Due to the COVID-19 crisis, this visit was done via telemedicine from my office and it was initiated and consent by this patient and or family.  I connected with [name], Ms. Befort's daughter with  Brittainy Merle on 07/15/21 by a telephone as video not available enabled telemedicine application and verified that I am speaking with the correct perso.   I discussed the limitations of evaluation and management by telemedicine. The patient expressed understanding and agreed to proceed.   Palliative Care was asked to follow this patient by consultation request of  Harlan Stains, MD to address advance care planning and complex medical decision making. This is a follow up visit                                   ASSESSMENT AND PLAN / RECOMMENDATIONS:  Symptom Management/Plan: Advance Care Planning; DNR in [facility]; Discussed Hospice, will revisit with [facility] eligibility, [name] in agreement  2. Tremors secondary to Parkinson : Daughter report tremors recently improved on staggering administration of Sinemet 25-100 one tablet 4 times a daily. Continue current plan of care. Continue supportive care, assist with ADLs as indicated while allowing patient to do as much as possible for self.   3. Constipation: Continue Bisacodyl. Started colace twice a day, with good bowel  regimen; Previously recommended mirlax daily, created mess with diarrhea. Encouraged adequate oral fluid intake for hydration.  4. Osteoporosis: stable without report of pathological fractures. Continue routine Prolia injections. Maintain safety. Discussed fall precautions to include consistent use of assistive device during ambulation. Continue Tylenol '500mg'$  twice a day for pain.  5. Goals of Care: Goals include to maximize quality of life and symptom management. Our advance care planning conversation included a discussion about:    The value and importance of advance care planning  Exploration of personal, cultural or spiritual beliefs that might influence medical decisions  Exploration of goals of care in the event of a sudden injury or illness  Identification and preparation of a healthcare agent  Review and updating or creation of an advance directive document.  6. Palliative care encounter; Palliative care encounter; Palliative medicine team will continue to support patient, patient's family, and medical team. Visit consisted of counseling and education dealing with the complex and emotionally intense issues of symptom management and palliative care in the setting of serious and potentially life-threatening illness  Provided General support and encouragement. Questions and concerns were addressed. Patient and her family was encouraged to call with questions and/or concerns.   Follow up Palliative Care Visit: Palliative care will continue to follow for complex medical decision making, advance care planning, and clarification of goals. Return 4 weeks or prn.  I spent 62 minutes providing this consultation. More than 50% of the time in this consultation was spent  in counseling and care coordination.  PPS: 40% Chief Complaint: Follow up Palliative consult for complex medical decision  making  HISTORY OF PRESENT ILLNESS:  Sarah Callahan is a 87 y.o. year old female  with multiple medical  problems including Parkinson's disease, type 2 diabetes, osteoporosis, Macular degeneration, GERD, hx of skin cancer. I called [name], Ms. Reasner's daughter by telephone for telemedicine visit as video not available. We talked about purpose of PC visit. We reviewed PMH of Parkinson as this is first visit with this provider. We talked about Parkinson, progressive disease. We talked about Ms. Pranke functional changes. I month ago she was ambulatory, now she is in a w/c, requires to be lifted for transfers, mobility. It takes 2 people to bath, dress, toilet her. Four weeks ago she was feeding herself, now she is unable to feed herself, [name] her daughter feeds her. We talked about appetite which appears good but she continues muscle wasting severe per daughter. We talked about does not appear to be loosing weight, no change in clothes size. We talked about symptoms of pain, continues to take tylenol for OA/OP with relief. [name] endorses she can not feel anything in her legs, very numb. We talked about constipation as the mirlax caused too much diarrhea. [name] endorses she has been using colace twice a day and they have a good bowel pattern, she goes once a week. We talked about nutrition, nutritional support. We talked about medical goals of care. We talked about with functional decline, maybe eligible for Hospice services. Valerie endorses Hospice did come out when she was ambulatory but not eligible at that time. Discussed with functional changes, can re-visit Hospice eligibility with [facility]. Valerie in agreement. We talked about role pc in poc. Discussed will review with [facility] re-ontact Prue with decision then decide either proceed with AV visit or schedule f/u PC visit. Valerie in agreement. Therapeutic listening, emotional support provided. Questions answered.   History obtained from review of EMR, discussion with Daughter [name] with Ms. Kelsh.  I reviewed  available labs, medications, imaging, studies and related documents from the EMR.  Records reviewed and summarized above.   ROS Full 14 system review of systems performed and negative with exception of: as per HPI.   Physical Exam: deferred Questions and concerns were addressed. The Daughter [name] was encouraged to call with questions and/or concerns. My contact information was provided. Provided general support and encouragement, no other unmet needs identified   Thank you for the opportunity to participate in the care of Ms. Surrette.  The palliative care team will continue to follow. Please call our office at [phone] if we can be of additional assistance.   This chart was dictated using voice recognition software.  Despite best efforts to proofread,  errors can occur which can change the documentation meaning.   Buster Schueller Ihor Gully, NP

## 2021-07-25 ENCOUNTER — Ambulatory Visit: Payer: Medicare Other | Admitting: Diagnostic Neuroimaging

## 2021-08-03 ENCOUNTER — Encounter: Payer: Self-pay | Admitting: Diagnostic Neuroimaging

## 2021-08-03 ENCOUNTER — Ambulatory Visit (INDEPENDENT_AMBULATORY_CARE_PROVIDER_SITE_OTHER): Payer: Medicare Other | Admitting: Diagnostic Neuroimaging

## 2021-08-03 DIAGNOSIS — G20 Parkinson's disease: Secondary | ICD-10-CM | POA: Diagnosis not present

## 2021-08-03 DIAGNOSIS — R269 Unspecified abnormalities of gait and mobility: Secondary | ICD-10-CM | POA: Diagnosis not present

## 2021-08-03 MED ORDER — CARBIDOPA-LEVODOPA 25-100 MG PO TABS: 1.0000 | ORAL_TABLET | Freq: Four times a day (QID) | ORAL | 4 refills | Status: AC

## 2021-08-03 NOTE — Patient Instructions (Signed)
  PARKINSON'S DISEASE (established problem, worsening) - continue carbidopa-levodopa dosing (1 tab 4 times per day) - use wheelchair  PALLIATIVE CARE - agree with supportive care; fortunately has great support from family as well

## 2021-08-03 NOTE — Progress Notes (Addendum)
PATIENT: Sarah Callahan DOB: 19-Feb-1931   REASON FOR VISIT: follow up for PD HISTORY FROM: patient and daughter (via phone)  Chief Complaint  Patient presents with   Tremors      HISTORY OF PRESENT ILLNESS:  UPDATE (08/03/21, VRP): Since last visit, more progression of Parkinson's disease.  Now patient is wheelchair-bound.  She is working with home hospice.  She is using carbidopa levodopa 1 tablet 4 times a day.  Patient needing significant help with ADLs including bathing and feeding.  However patient does still appear to be in good spirits by telephone conversation.    UPDATE (09/06/20, VRP): Since last visit, some progression of symptoms. On carb/levo 6 tabs per day (2/1/1/2). No alleviating or aggravating factors. Tolerating meds. BP fluctuates. Some dizzy spells.   UPDATE (09/03/19, VRP): Since last visit, doing about the same. Some falls in last few weeks (bathroom). Less activity during pandemic. Now on carb/levo 1 tab 4x per day (every 4 hours).   UPDATE (08/27/18, VRP): Since last visit, doing poorly. Now living with family. Symptoms are progressive. More falls and syncope attacks. Severity is high. Tolerating meds. Using walker, but forget to use at times. Continues with peak dyskinesias.  UPDATE (02/19/18, VRP): Since last visit, doing poorly; multiple falls (4/3, 4/8, 4/12). Went to ER. Had small right frontal SAH on CT head. Followup CT stable. Tolerating CARB/LEVO. No alleviating or aggravating factors.   UPDATE (11/27/17, VRP): Since last visit, doing well. Tolerating meds. Now on carb/levo 25/100 (9 tabs divided over 4 times per day). No alleviating or aggravating factors. Has had more falls since last visit, but these occurred when she was note using cane or walker, and she was "doing things I am not supposed to do" such as cleaning up in the basement, bending and lifting items etc. When she uses cane and rollator, she does well. Now grand-daughter [name] and her friends  (a married couple) have moved in with patient and great-grand daughter, and this arrangement is helping a lot.   UPDATE 05/23/17: Since last visit, had a difficult May 2018; had multiple falls on to the cement and on to the coffee table. Was not using rollator at that time. More stress in Apr and May 2018. Went to ER, and CT head negative. Now feeling better. Now using the rollator more and not having any falls. Tolerating carb/levo. No wearing off.  UPDATE 11/21/16: Since last visit, pt is stable with PD. She has had some falls, but no major injuries. Using a cane and her scooter. Sometimes uses walker. Also unfortunately pt's husband passed away in Nov 04, 2016 from heart attack (age 71 yrs old). Pt living at home with grand-daughter and son-in-law. More fatigue in early AM. Some recent positional vertigo when getting up in the AM. No nausea or vomiting.   UPDATE 05/15/16: Since last visit, doing fair; some wearing off after 7:30am dose --> by 10:30-11 feels wearing off. Afternoon is better. Sleep and gait otherwise stable.   UPDATE 11/17/15: Since last visit, doing well on carb/levo 1.5 tabs QID. Overall doing well. 2-3 wks ago, stood up quickly, then fell backwards and thinks she passed out. Her family was there and caught her from falling to ground. She regained consciousness quickly. No major injuries or post ictal confusion.   UPDATE 12/07/14 (VRP): Since last visit, she feel stable from PD standpoint. No wearing off. Some nightmares and restless legs at night. Still with stress related to family issues. Now only husband and  great-grand-daughter are at home.   UPDATE 09/04/14 (VRP): Since last visit, feels better. Taking carb/levo (1.5/1.5/1/1) and doing better. Still with tremor, slowness, stiffness. Still with balance diff. No falls. Uses a walker at times. Her husband had a "light stroke" in July 2015, with good recovery. He does most of driving. Patient still drives a little (daytime). Patient's  grand-daughter (and her boyfriend) and great-grand-daughter live with them. Patient has daughter and another granddaughter who could help her in the future if her health declines.   UPDATE 03/02/14 (VRP): Since last visit, she notes that her PD symptoms are more significant in AM (with dose 1 and 2) but sig improve with doses 3 and 4. Now having intermittent dystonic posturing of left foot (previously only in right foot). Some restless legs at night. No falls. Uses a scooter or shopping cart for longer distances. Able to get around at home holding on to walls etc.   UPDATE 10/10/13 (LL):  Since last visit, she started increasing Sinemet from TID to QID, with good results.  Sometimes she is so busy she forgets to take extra dose.  Dystonia is the same in right foot, but she would rather hold off on starting another medication until she has to.  She has custody of her great-granddaughter who is 7 and has ADHD.  UPDATE 07/10/13 (VP): Since last visit, patient is noted some wearing off between dosages. She also is noted dystonia in the right foot. This has been happening over the past 2-3 months. She also has had increasing falls and balance problems. Patient is alone for this visit. She is not using a walker or cane.   UPDATE 05/29/12: Doing well. Taking carb/levo TID. No wearing off. No dyskinesias. She is happy with current dosing. PT has helped. Uses a cane for distance walking.   UPDATE 12/22/11: Doing better on carb/levo. notices tremor is worse if she misses a dose. Still with balance diff, and has fallen down. Also found that ther gas fireplace was leaking carbon monoide into the home. Now replaced, and her fatigue is better.   PRIOR HPI: 87 year old right-handed female with history of diabetes, hypothyroidism, osteoporosis, here for evaluation of tremor. patient reports progressive right upper extremity tremor for the past 1-1/2 years. Tremor is mainly present at rest but sometimes when she holds a cup or  utensils. Tremor worsens with stress. Patient also reports unsteady gait and balance, intermittent drooling, and constipation. No significant change in sense of taste or smell. No reports of sleep disturbance or hallucination. No family history of Parkinson's disease.    REVIEW OF SYSTEMS: Full 14 system review of systems performed and negative except: as per HPI.      ALLERGIES: Allergies  Allergen Reactions   Azilect [Rasagiline] Nausea And Vomiting   Cephalexin Other (See Comments)    Gets UTI after taking   Codeine Itching   Crestor [Rosuvastatin Calcium] Other (See Comments)    Tired, confused, GI issues   E-Mycin [Erythromycin Base] Nausea And Vomiting   Elavil [Amitriptyline] Other (See Comments)   Fosamax [Alendronate] Other (See Comments)    Body aches   Influenza Virus Vaccine Split Other (See Comments)    Side effects   Iodine Other (See Comments)    infection   Lidocaine Other (See Comments)    Feels hot, turns red   Neurontin [Gabapentin] Other (See Comments)    Depression   Novocain [Procaine] Other (See Comments)   Penicillins Hives   Pravastatin Other (See Comments)  Bone and joint pain   Sulfa Drugs Cross Reactors Hives   Tramadol Other (See Comments)    Muscle pain   Darvon [Propoxyphene] Rash   Mercury Rash    HOME MEDICATIONS: Outpatient Medications Prior to Visit  Medication Sig Dispense Refill   Bioflavonoid Products (VITAMIN C) CHEW Chew by mouth 2 (two) times daily.     Cholecalciferol (VITAMIN D-3) 5000 units TABS Take 1 tablet by mouth 2 (two) times daily.     Coenzyme Q10 (COQ-10 PO) Take 1 tablet by mouth daily.      denosumab (PROLIA) 60 MG/ML SOLN injection Inject 60 mg into the skin every 6 (six) months. Administer in upper arm, thigh, or abdomen     DHA-EPA-Vitamin E (OMEGA-3 COMPLEX PO) Take 1 capsule by mouth daily.     Flaxseed, Linseed, (FLAX SEED OIL PO) Take 1 tablet by mouth daily.     fluticasone (FLONASE) 50 MCG/ACT nasal  spray Place 2 sprays into both nostrils as needed.      furosemide (LASIX) 20 MG tablet Take 1 tablet by mouth daily.     levothyroxine (SYNTHROID, LEVOTHROID) 75 MCG tablet Take 75 mcg by mouth daily.     Magnesium 250 MG TABS Take 250 mg by mouth daily.      Melatonin 3 MG TABS Take 3 mg by mouth at bedtime.     Multiple Vitamins-Minerals (HEALTHY EYES PO) Take 1 capsule by mouth 2 (two) times daily.     Multiple Vitamins-Minerals (MULTI-VITAMIN GUMMIES PO) Take 2 tablets by mouth daily. gummie     oxyCODONE (ROXICODONE) 5 MG immediate release tablet Take 0.5 tablets (2.5 mg total) by mouth every 6 (six) hours as needed for breakthrough pain. 8 tablet 0   PARoxetine (PAXIL) 10 MG tablet Take 10 mg by mouth daily. One tablet daily     Thiamine HCl (VITAMIN B-1 PO) Take 1 tablet by mouth daily.     vitamin E (VITAMIN E) 200 UNIT capsule Take 400 Units by mouth daily.      carbidopa-levodopa (SINEMET IR) 25-100 MG tablet Take 2 tablets by mouth 3 (three) times daily. 540 tablet 4   No facility-administered medications prior to visit.     DIAGNOSTIC DATA (LABS, IMAGING, TESTING) - I reviewed patient records, labs, notes, testing and imaging myself where available.  Lab Results  Component Value Date   WBC 5.8 02/06/2018   HGB 12.2 02/06/2018   HCT 37.9 02/06/2018   MCV 93.8 02/06/2018   PLT 248 02/06/2018      Component Value Date/Time   NA 141 04/30/2013 0820   K 4.0 04/30/2013 0820   CL 99 04/30/2013 0820   CO2 30 09/07/2008 1600   GLUCOSE 122* 04/30/2013 0820   BUN 18 04/30/2013 0820   CREATININE 0.70 04/30/2013 0820   CALCIUM 9.8 09/07/2008 1600   GFRNONAA >60 09/07/2008 1600   GFRAA  Value: >60   09/07/2008 1600    10/26/11 MRI brain - mild bitemporal atrophy   02/06/18 CT HEAD:  1. Acute small volume RIGHT frontal subarachnoid hemorrhage. 2. Moderate RIGHT frontal scalp hematoma.  No skull fracture. 3. Stable moderate parenchymal brain volume loss and mild chronic small vessel  ischemic disease.   02/06/18 CT ORBITS:  1. RIGHT periorbital soft tissue swelling/contusion without postseptal involvement.   02/06/18 CT CERVICAL SPINE:   1. No acute fracture or malalignment. 2. Moderate to severe C4-5 and C5-6 neural foraminal narrowing.  02/15/18 CT head  - Small volume subarachnoid  hemorrhage over the RIGHT frontal convexity is improved compared with 02/06/2018, but still visible. No new areas of hemorrhage are observed. - Stable atrophy and small vessel disease.  No skull fracture.   ASSESSMENT AND PLAN 87 y.o. female with diabetes, hypothyroidism, osteoporosis, with progressive right upper extremity resting tremor. She has cogwheel rigidity, bradykinesia, stooped posture, and Myerson's sign on exam. Continued progression of disease. Had orthostatic hypotension event / syncope in Jan 2017, due to rapidly standing. Has significant gait difficulty.   Meds tried: carb/levo, rasagiline (nausea)  Dx: idiopathic Parkinson's disease   Parkinson's disease ([place])  Gait difficulty   PLAN:   PARKINSON'S DISEASE (established problem, worsening) - continue carbidopa-levodopa dosing (1 tab 4 times per day) - use wheelchair  PALLIATIVE CARE - agree with supportive care; fortunately has great support from family as well  Meds ordered this encounter  Medications   carbidopa-levodopa (SINEMET IR) 25-100 MG tablet    Sig: Take 1 tablet by mouth 4 (four) times daily.    Dispense:  360 tablet    Refill:  4   Return in about 1 year (around 08/03/2022) for pending if symptoms worsen or fail to improve.   I connected with  Prudence Davidson on 08/03/21 by a telephone enabled telemedicine application and verified that I am speaking with the correct person using two identifiers.  I am at my office. Patient is at their home.   I spent 15 minutes of non-face-to-face time with patient.  This included previsit chart review, lab review, study review, order entry, electronic health  record documentation, patient education.     I discussed the limitations of evaluation and management by telemedicine. The patient expressed understanding and agreed to proceed.   Penni Bombard, MD 11/15/3157, 4:58 PM Certified in Neurology, Neurophysiology and Neuroimaging  [facility] [address] [place], [place] [address] [phone]

## 2021-09-07 ENCOUNTER — Ambulatory Visit: Payer: Medicare Other | Admitting: Diagnostic Neuroimaging

## 2021-10-04 ENCOUNTER — Telehealth: Payer: Self-pay | Admitting: Diagnostic Neuroimaging

## 2021-10-04 NOTE — Telephone Encounter (Signed)
Called Renee who is patient's Hospice nurse. She stated family concerned that she gets stiff as a board, can't be moved. She is wheelchair bound, need assistance with all ADL's. Can swallow food, liquids, some difficulty with medications. She is taking carb/levo 25-100 4 x daily. I advised will send to Dr Leta Baptist and call her back. Renee verbalized understanding, appreciation.

## 2021-10-04 NOTE — Telephone Encounter (Signed)
Returned phone from [place]. Pt getting stiff as a board, family is concerned. Would like a call back. Will be available until 5 pm.

## 2021-10-04 NOTE — Telephone Encounter (Signed)
Renee with [facility] called states pt has the spells where she becomes stiff for 35-45 mins, not sure what is causing this. Renee requesting a call back0

## 2021-10-04 NOTE — Telephone Encounter (Signed)
Called Renee, LVM requesting call back.

## 2021-10-04 NOTE — Telephone Encounter (Signed)
Per Dr Leta Baptist I spoke with [facility] and advised her that unless patient has not tolerated higher doses of carb/levo in past she can increase to 1.5 tabs 4 x daily. If family notices episodes at same time of day those carb/levo doses could be increased first.  If this is not tolerated or helpful family can consider medication like diazepam to use as needed. Renee verbalized understanding, appreciation.

## 2021-11-07 ENCOUNTER — Other Ambulatory Visit: Payer: Self-pay

## 2021-11-07 ENCOUNTER — Inpatient Hospital Stay
Admission: EM | Admit: 2021-11-07 | Discharge: 2021-11-10 | DRG: 177 | Disposition: A | Discharge status: Hospice | Attending: Internal Medicine | Admitting: Internal Medicine

## 2021-11-07 ENCOUNTER — Emergency Department

## 2021-11-07 DIAGNOSIS — Z880 Allergy status to penicillin: Secondary | ICD-10-CM

## 2021-11-07 DIAGNOSIS — Z79890 Hormone replacement therapy (postmenopausal): Secondary | ICD-10-CM

## 2021-11-07 DIAGNOSIS — U071 COVID-19: Secondary | ICD-10-CM | POA: Diagnosis not present

## 2021-11-07 DIAGNOSIS — Z882 Allergy status to sulfonamides status: Secondary | ICD-10-CM

## 2021-11-07 DIAGNOSIS — E878 Other disorders of electrolyte and fluid balance, not elsewhere classified: Secondary | ICD-10-CM | POA: Diagnosis present

## 2021-11-07 DIAGNOSIS — R4182 Altered mental status, unspecified: Secondary | ICD-10-CM | POA: Diagnosis present

## 2021-11-07 DIAGNOSIS — G4710 Hypersomnia, unspecified: Secondary | ICD-10-CM | POA: Diagnosis present

## 2021-11-07 DIAGNOSIS — F32A Depression, unspecified: Secondary | ICD-10-CM | POA: Diagnosis present

## 2021-11-07 DIAGNOSIS — Z91041 Radiographic dye allergy status: Secondary | ICD-10-CM

## 2021-11-07 DIAGNOSIS — Z8249 Family history of ischemic heart disease and other diseases of the circulatory system: Secondary | ICD-10-CM

## 2021-11-07 DIAGNOSIS — Z66 Do not resuscitate: Secondary | ICD-10-CM | POA: Diagnosis present

## 2021-11-07 DIAGNOSIS — F0280 Dementia in other diseases classified elsewhere without behavioral disturbance: Secondary | ICD-10-CM | POA: Diagnosis present

## 2021-11-07 DIAGNOSIS — Z888 Allergy status to other drugs, medicaments and biological substances status: Secondary | ICD-10-CM

## 2021-11-07 DIAGNOSIS — E860 Dehydration: Secondary | ICD-10-CM | POA: Diagnosis not present

## 2021-11-07 DIAGNOSIS — R296 Repeated falls: Secondary | ICD-10-CM | POA: Diagnosis present

## 2021-11-07 DIAGNOSIS — E871 Hypo-osmolality and hyponatremia: Secondary | ICD-10-CM | POA: Diagnosis present

## 2021-11-07 DIAGNOSIS — Z885 Allergy status to narcotic agent status: Secondary | ICD-10-CM

## 2021-11-07 DIAGNOSIS — E1142 Type 2 diabetes mellitus with diabetic polyneuropathy: Secondary | ICD-10-CM | POA: Diagnosis present

## 2021-11-07 DIAGNOSIS — Z85828 Personal history of other malignant neoplasm of skin: Secondary | ICD-10-CM

## 2021-11-07 DIAGNOSIS — G20A1 Parkinson's disease without dyskinesia, without mention of fluctuations: Secondary | ICD-10-CM | POA: Diagnosis present

## 2021-11-07 DIAGNOSIS — Z7984 Long term (current) use of oral hypoglycemic drugs: Secondary | ICD-10-CM

## 2021-11-07 DIAGNOSIS — Z9849 Cataract extraction status, unspecified eye: Secondary | ICD-10-CM

## 2021-11-07 DIAGNOSIS — G9341 Metabolic encephalopathy: Secondary | ICD-10-CM | POA: Diagnosis present

## 2021-11-07 DIAGNOSIS — E876 Hypokalemia: Secondary | ICD-10-CM | POA: Diagnosis present

## 2021-11-07 DIAGNOSIS — K219 Gastro-esophageal reflux disease without esophagitis: Secondary | ICD-10-CM | POA: Diagnosis present

## 2021-11-07 DIAGNOSIS — R54 Age-related physical debility: Secondary | ICD-10-CM | POA: Insufficient documentation

## 2021-11-07 DIAGNOSIS — Z993 Dependence on wheelchair: Secondary | ICD-10-CM

## 2021-11-07 DIAGNOSIS — E7800 Pure hypercholesterolemia, unspecified: Secondary | ICD-10-CM | POA: Diagnosis present

## 2021-11-07 DIAGNOSIS — R0602 Shortness of breath: Secondary | ICD-10-CM

## 2021-11-07 DIAGNOSIS — H9193 Unspecified hearing loss, bilateral: Secondary | ICD-10-CM | POA: Insufficient documentation

## 2021-11-07 DIAGNOSIS — E039 Hypothyroidism, unspecified: Secondary | ICD-10-CM | POA: Diagnosis present

## 2021-11-07 DIAGNOSIS — Z79899 Other long term (current) drug therapy: Secondary | ICD-10-CM

## 2021-11-07 DIAGNOSIS — G20 Parkinson's disease: Secondary | ICD-10-CM | POA: Diagnosis present

## 2021-11-07 DIAGNOSIS — Z823 Family history of stroke: Secondary | ICD-10-CM

## 2021-11-07 LAB — CBC WITH DIFFERENTIAL/PLATELET
Abs Immature Granulocytes: 0.04 10*3/uL (ref 0.00–0.07)
Basophils Absolute: 0 10*3/uL (ref 0.0–0.1)
Basophils Relative: 0 %
Eosinophils Absolute: 0 10*3/uL (ref 0.0–0.5)
Eosinophils Relative: 1 %
HCT: 35.4 % — ABNORMAL LOW (ref 36.0–46.0)
Hemoglobin: 12 g/dL (ref 12.0–15.0)
Immature Granulocytes: 1 %
Lymphocytes Relative: 8 %
Lymphs Abs: 0.6 10*3/uL — ABNORMAL LOW (ref 0.7–4.0)
MCH: 30.5 pg (ref 26.0–34.0)
MCHC: 33.9 g/dL (ref 30.0–36.0)
MCV: 90.1 fL (ref 80.0–100.0)
Monocytes Absolute: 0.4 10*3/uL (ref 0.1–1.0)
Monocytes Relative: 5 %
Neutro Abs: 7.4 10*3/uL (ref 1.7–7.7)
Neutrophils Relative %: 85 %
Platelets: 293 10*3/uL (ref 150–400)
RBC: 3.93 MIL/uL (ref 3.87–5.11)
RDW: 12.2 % (ref 11.5–15.5)
WBC: 8.6 10*3/uL (ref 4.0–10.5)
nRBC: 0 % (ref 0.0–0.2)

## 2021-11-07 LAB — RESP PANEL BY RT-PCR (FLU A&B, COVID) ARPGX2
Influenza A by PCR: NEGATIVE
Influenza B by PCR: NEGATIVE
SARS Coronavirus 2 by RT PCR: POSITIVE — AB

## 2021-11-07 LAB — COMPREHENSIVE METABOLIC PANEL
ALT: <5 U/L (ref 0–44)
AST: 23 U/L (ref 15–41)
Albumin: 3 g/dL — ABNORMAL LOW (ref 3.5–5.0)
Alkaline Phosphatase: 36 U/L — ABNORMAL LOW (ref 38–126)
Anion gap: 9 (ref 5–15)
BUN: 19 mg/dL (ref 8–23)
CO2: 23 mmol/L (ref 22–32)
Calcium: 8.2 mg/dL — ABNORMAL LOW (ref 8.9–10.3)
Chloride: 95 mmol/L — ABNORMAL LOW (ref 98–111)
Creatinine, Ser: 0.41 mg/dL — ABNORMAL LOW (ref 0.44–1.00)
GFR, Estimated: >60 mL/min (ref >60)
Glucose, Bld: 219 mg/dL — ABNORMAL HIGH (ref 70–99)
Potassium: 3.4 mmol/L — ABNORMAL LOW (ref 3.5–5.1)
Sodium: 127 mmol/L — ABNORMAL LOW (ref 135–145)
Total Bilirubin: 0.7 mg/dL (ref 0.3–1.2)
Total Protein: 6 g/dL — ABNORMAL LOW (ref 6.5–8.1)

## 2021-11-07 MED ORDER — ONDANSETRON HCL 4 MG/2ML IJ SOLN: 4.0000 mg | Freq: Four times a day (QID) | INTRAMUSCULAR | Status: DC | PRN

## 2021-11-07 MED ORDER — SODIUM CHLORIDE 0.9% FLUSH
3.0000 mL | Freq: Two times a day (BID) | INTRAVENOUS | Status: DC
  Administered 2021-11-08 – 2021-11-10 (×4): 3 mL via INTRAVENOUS

## 2021-11-07 MED ORDER — ACETAMINOPHEN 650 MG RE SUPP: 650.0000 mg | Freq: Four times a day (QID) | RECTAL | Status: DC | PRN

## 2021-11-07 MED ORDER — CARBIDOPA-LEVODOPA 25-100 MG PO TABS
1.0000 | ORAL_TABLET | Freq: Four times a day (QID) | ORAL | Status: DC
  Administered 2021-11-07 – 2021-11-10 (×8): 1 via ORAL
  Filled 2021-11-07 (×8): qty 1

## 2021-11-07 MED ORDER — TRAZODONE HCL 50 MG PO TABS: 50.0000 mg | ORAL_TABLET | Freq: Every evening | ORAL | Status: DC | PRN

## 2021-11-07 MED ORDER — NIRMATRELVIR/RITONAVIR (PAXLOVID)TABLET: 3.0000 | ORAL_TABLET | Freq: Two times a day (BID) | ORAL | Status: DC

## 2021-11-07 MED ORDER — ONDANSETRON HCL 4 MG PO TABS: 4.0000 mg | ORAL_TABLET | Freq: Four times a day (QID) | ORAL | Status: DC | PRN

## 2021-11-07 MED ORDER — ACETAMINOPHEN 325 MG PO TABS: 650.0000 mg | ORAL_TABLET | Freq: Four times a day (QID) | ORAL | Status: DC | PRN

## 2021-11-07 MED ORDER — LEVOTHYROXINE SODIUM 25 MCG PO TABS
75.0000 ug | ORAL_TABLET | Freq: Every day | ORAL | Status: DC
  Administered 2021-11-09: 06:00:00 75 ug via ORAL
  Filled 2021-11-07 (×2): qty 1
  Filled 2021-11-07: qty 2

## 2021-11-07 MED ORDER — LACTATED RINGERS IV SOLN: INTRAVENOUS | Status: DC

## 2021-11-07 MED ORDER — LACTATED RINGERS IV BOLUS
1000.0000 mL | Freq: Once | INTRAVENOUS | Status: AC
  Administered 2021-11-07: 1000 mL via INTRAVENOUS

## 2021-11-07 MED ORDER — NIRMATRELVIR/RITONAVIR (PAXLOVID) TABLET (RENAL DOSING)
2.0000 | ORAL_TABLET | Freq: Two times a day (BID) | ORAL | Status: DC
  Filled 2021-11-07: qty 2

## 2021-11-07 MED ORDER — PAROXETINE HCL 10 MG PO TABS
10.0000 mg | ORAL_TABLET | Freq: Every day | ORAL | Status: DC
  Administered 2021-11-08 – 2021-11-10 (×3): 10 mg via ORAL
  Filled 2021-11-07 (×4): qty 1

## 2021-11-07 MED ORDER — METFORMIN HCL ER 500 MG PO TB24
500.0000 mg | ORAL_TABLET | Freq: Every day | ORAL | Status: DC
  Administered 2021-11-08 – 2021-11-09 (×2): 500 mg via ORAL
  Filled 2021-11-07 (×3): qty 1

## 2021-11-07 MED ORDER — POLYETHYLENE GLYCOL 3350 17 G PO PACK: 17.0000 g | PACK | Freq: Every day | ORAL | Status: DC | PRN

## 2021-11-07 MED ORDER — ENOXAPARIN SODIUM 40 MG/0.4ML IJ SOSY
40.0000 mg | PREFILLED_SYRINGE | INTRAMUSCULAR | Status: DC
  Administered 2021-11-07 – 2021-11-09 (×3): 40 mg via SUBCUTANEOUS
  Filled 2021-11-07 (×3): qty 0.4

## 2021-11-07 MED ORDER — OXYCODONE HCL 5 MG PO TABS: 5.0000 mg | ORAL_TABLET | ORAL | Status: DC | PRN

## 2021-11-07 NOTE — H&P (Signed)
[facility] History and Physical  Sarah Callahan OIZ:[id number] DOB: 1931/07/30 DOA: 11/07/2021  Referring physician: Dr. Tamala Julian PCP: Harlan Stains, MD   Chief Complaint: confusion  HPI: Sarah Callahan is a 88 y.o. female Parkinson's, type 2 diabetes, recurrent falls, who presents with concern for COVID infection.  30 of history obtained from granddaughter who is at bedside during interview.  Patient was capable of engaging in basic history taking, reports that she has had some coughing but denies fever, chest pain, shortness of breath.  Granddaughter reports that she lives with one of her daughters and that a few people in the household tested positive for COVID approximately 4 days ago.  She also notes in the last few days that her grandmother has had cough and significant amount of diarrhea.  She also reports she has not been eating or drinking well over the last few days and does not seem like her self.  When she went to check on her today she was very concerned about her overall state and brought her to the emergency department for evaluation.  She reports she has been on a sort of "palliative hospice" but does not believe it is for end-of-life care, merely for nursing assistance in the home and that they have had services for well over 6 months.  She confirms that her grandmother is DNR/DNI but would want medical interventions.  In the ED initial vital signs were unremarkable.  Lab work-up notable for mild hyponatremia of 127, as well as mild hypokalemia and hypochloremia.  Glucose elevated to 219.  Creatinine normal at 0.4, remainder of CMP without major abnormality.  CBC was unremarkable.  Chest x-ray showed no acute findings.  EKG without acute ischemic findings.  COVID swab was positive.  She was given a 1 L LR bolus and admitted for observation.  Review of Systems:  Pertinent positives and negative per HPI, all others reviewed and negative  Past Medical History:  Diagnosis  Date   Actinic keratosis    Arthritis    Basal cell carcinoma 06/26/2019   Left temple. Superficial. Tx: EDC   Basal cell carcinoma 10/21/2020   R shoulder - observation    Cancer ([place])    forehead, basal cell   Diabetes mellitus    diet controlled   Dyspepsia    Falls    GERD (gastroesophageal reflux disease)    H/O: rheumatic fever    Hypercalcemia    Hypercholesteremia    Hypothyroidism    Macular degeneration    OA (osteoarthritis of spine)    , Back   Osteoporosis    Osteoporosis    Pancreatitis    Parkinson disease ([place])    Peripheral edema    PONV (postoperative nausea and vomiting)    Squamous cell carcinoma of skin 01/23/2019   Right forearm. SCCis   Thoracic compression fracture (HCC)    Vitamin D deficiency    Wears glasses    Past Surgical History:  Procedure Laterality Date   ABDOMINAL HYSTERECTOMY     APPENDECTOMY     CARPAL TUNNEL RELEASE     both   CATARACT EXTRACTION     COLONOSCOPY     EYE SURGERY     both cataracts   FOOT SURGERY  2009   hammer toes   HAND SURGERY     HEMORRHOID SURGERY     MOHS SURGERY Right 09/2015, 2020   forehead, basal cell   TONSILLECTOMY     TRIGGER FINGER RELEASE Right 04/30/2013  Procedure: RELEASE TRIGGER FINGER/A-1 PULLEY RIGHT RING FINGER;  Surgeon: Wynonia Sours, MD;  Location: [facility];  Service: Orthopedics;  Laterality: Right;   Social History:  reports that she has never smoked. She has never used smokeless tobacco. She reports that she does not drink alcohol and does not use drugs.  Allergies  Allergen Reactions   Azilect [Rasagiline] Nausea And Vomiting   Cephalexin Other (See Comments)    Gets UTI after taking   Codeine Itching   Crestor [Rosuvastatin Calcium] Other (See Comments)    Tired, confused, GI issues   E-Mycin [Erythromycin Base] Nausea And Vomiting   Elavil [Amitriptyline] Other (See Comments)   Fosamax [Alendronate] Other (See Comments)    Body aches   Influenza Virus  Vaccine Split Other (See Comments)    Side effects   Iodine Other (See Comments)    infection   Lidocaine Other (See Comments)    Feels hot, turns red   Neurontin [Gabapentin] Other (See Comments)    Depression   Novocain [Procaine] Other (See Comments)   Penicillins Hives   Pravastatin Other (See Comments)    Bone and joint pain   Sulfa Drugs Cross Reactors Hives   Tramadol Other (See Comments)    Muscle pain   Darvon [Propoxyphene] Rash   Mercury Rash    Family History  Problem Relation Age of Onset   Heart disease Mother    Heart disease Father    Heart disease Brother    Heart disease Maternal Grandmother    Heart disease Maternal Grandfather    Stroke Maternal Grandfather    Heart disease Paternal Grandmother    Heart disease Paternal Grandfather    Heart attack Paternal Grandfather      Prior to Admission medications   Medication Sig Start Date End Date Taking? Authorizing Provider  Bioflavonoid Products (VITAMIN C) CHEW Chew by mouth 2 (two) times daily. Patient not taking: Reported on 10/04/2021    [provider]  carbidopa-levodopa (SINEMET IR) 25-100 MG tablet Take 1 tablet by mouth 4 (four) times daily. 08/03/21 10/27/22  Penumalli, Earlean Polka, MD  Cholecalciferol (VITAMIN D-3) 5000 units TABS Take 1 tablet by mouth 2 (two) times daily. Patient not taking: Reported on 10/04/2021    [provider]  Coenzyme Q10 (COQ-10 PO) Take 1 tablet by mouth daily.  Patient not taking: Reported on 10/04/2021    [provider]  denosumab (PROLIA) 60 MG/ML SOLN injection Inject 60 mg into the skin every 6 (six) months. Administer in upper arm, thigh, or abdomen Patient not taking: Reported on 10/04/2021    [provider]  DHA-EPA-Vitamin E (OMEGA-3 COMPLEX PO) Take 1 capsule by mouth daily. Patient not taking: Reported on 10/04/2021    [provider]  Flaxseed, Linseed, (FLAX SEED OIL PO) Take 1 tablet by mouth daily. Patient not  taking: Reported on 10/04/2021    [provider]  fluticasone (FLONASE) 50 MCG/ACT nasal spray Place 2 sprays into both nostrils as needed.  Patient not taking: Reported on 10/04/2021 08/14/13   [provider]  furosemide (LASIX) 20 MG tablet Take 1 tablet by mouth daily. Patient not taking: Reported on 10/04/2021 08/23/14   [provider]  levothyroxine (SYNTHROID, LEVOTHROID) 75 MCG tablet Take 75 mcg by mouth daily.    [provider]  Magnesium 250 MG TABS Take 250 mg by mouth daily.  Patient not taking: Reported on 10/04/2021    [provider]  Melatonin 3  MG TABS Take 3 mg by mouth at bedtime. Patient not taking: Reported on 10/04/2021    [provider]  metFORMIN (GLUCOPHAGE-XR) 500 MG 24 hr tablet SMARTSIG:1 Tablet(s) By Mouth Every Evening 08/22/21   [provider]  Multiple Vitamins-Minerals (HEALTHY EYES PO) Take 1 capsule by mouth 2 (two) times daily. Patient not taking: Reported on 10/04/2021    [provider]  Multiple Vitamins-Minerals (MULTI-VITAMIN GUMMIES PO) Take 2 tablets by mouth daily. gummie Patient not taking: Reported on 10/04/2021    [provider]  oxyCODONE (ROXICODONE) 5 MG immediate release tablet Take 0.5 tablets (2.5 mg total) by mouth every 6 (six) hours as needed for breakthrough pain. Patient not taking: Reported on 10/04/2021 04/09/18   Carrie Mew, MD  PARoxetine (PAXIL) 10 MG tablet Take 10 mg by mouth daily. One tablet daily    [provider]  Thiamine HCl (VITAMIN B-1 PO) Take 1 tablet by mouth daily. Patient not taking: Reported on 10/04/2021    [provider]  vitamin E (VITAMIN E) 200 UNIT capsule Take 400 Units by mouth daily.  Patient not taking: Reported on 10/04/2021    [provider]   Physical Exam: Vitals:   11/07/21 1310 11/07/21 1317 11/07/21 1319  BP:  126/69   Pulse:  76   Resp:  19   Temp:  (!) 97.4 F (36.3 C)    TempSrc:  Oral   SpO2: 95% 97%   Weight:   59 kg  Height:   5\' 3"  (1.6 m)    Wt Readings from Last 3 Encounters:  11/07/21 59 kg  09/06/20 58.1 kg  09/03/19 56.6 kg     General:  Appears calm and comfortable Eyes: PERRL, normal lids, irises & conjunctiva ENT: grossly normal hearing, lips & tongue Neck: no masses Cardiovascular: RRR, no m/r/g. No LE edema. Respiratory: CTA bilaterally, no w/r/r. Normal respiratory effort. Abdomen: soft, ntnd Skin: no rash or induration seen on limited exam Musculoskeletal: grossly normal tone BUE/BLE Psychiatric: grossly normal mood and affect, speech slow but fluent and appropriate Neurologic: resting tremor noted in R hand. Speech very slow.           Labs on Admission:  Basic Metabolic Panel: Recent Labs  Lab 11/07/21 1323  NA 127*  K 3.4*  CL 95*  CO2 23  GLUCOSE 219*  BUN 19  CREATININE 0.41*  CALCIUM 8.2*   Liver Function Tests: Recent Labs  Lab 11/07/21 1323  AST 23  ALT <5  ALKPHOS 36*  BILITOT 0.7  PROT 6.0*  ALBUMIN 3.0*   No results for input(s): LIPASE, AMYLASE in the last 168 hours. No results for input(s): AMMONIA in the last 168 hours. CBC: Recent Labs  Lab 11/07/21 1323  WBC 8.6  NEUTROABS 7.4  HGB 12.0  HCT 35.4*  MCV 90.1  PLT 293   Cardiac Enzymes: No results for input(s): CKTOTAL, CKMB, CKMBINDEX, TROPONINI in the last 168 hours.  BNP (last 3 results) No results for input(s): BNP in the last 8760 hours.  ProBNP (last 3 results) No results for input(s): PROBNP in the last 8760 hours.  CBG: No results for input(s): GLUCAP in the last 168 hours.  Radiological Exams on Admission: DG Chest Port 1 View  Result Date: 11/07/2021 CLINICAL DATA:  Altered mental status. EXAM: PORTABLE CHEST 1 VIEW COMPARISON:  April 09, 2018. FINDINGS: Stable cardiomediastinal silhouette. Moderate size hiatal hernia is noted. Mild bibasilar subsegmental atelectasis is noted. Bony thorax is unremarkable.  IMPRESSION: Moderate size hiatal hernia. Mild bibasilar subsegmental atelectasis. Electronically Signed   By: Marijo Conception M.D.   On: 11/07/2021 15:06    EKG: Independently reviewed.  Sinus rhythm, no ST changes.  Compared to prior R wave progression is more rapid but otherwise unchanged.  Assessment/Plan Principal Problem:   AMS (altered mental status) Active Problems:   Type 2 diabetes mellitus with peripheral neuropathy (HCC)   Parkinson's disease ([place])   Sarah Callahan is a 88 y.o. female Parkinson's, type 2 diabetes, recurrent falls, who presents with decreased p.o. intake and found to be COVID-positive.  #COVID-19 infection Currently with normal oxygenation and negative chest x-ray.  High risk for progression, discussed course of Paxlovid with granddaughter who is in agreement. - 5-day course of Paxlovid  #Hyponatremia Likely secondary to low volume status, status post 1 L LR bolus in the ED.  Will give IV fluids overnight and reassess in a.m.  #Chronic medical problems Parkinson's-continue Sinemet 4 times daily  Hypothyroidism-continue Synthroid  DM2-continue metformin 500 mg nightly  Pression-continue Paxil    Code Status: DNI/DNI, confirmed DVT Prophylaxis: Lovenox Family Communication: Granddaughter updated at bedside Disposition Plan: Observation, Med-Surg   Time spent: 24 min  Clarnce Flock MD/MPH [facility]  Note:  This document was prepared using Systems analyst and may include unintentional dictation errors.

## 2021-11-07 NOTE — ED Notes (Signed)
Assisted pt with water and straw and pt drank 1 cup of water. Denied any other needs at this time

## 2021-11-07 NOTE — Progress Notes (Signed)
[facility] ED11H [facility]) Hospitalized Hospice Patient Visit   Sarah Callahan is a current hospice patient with a terminal diagnosis of Parkinson's Disease. Notified by hospice nurse that patient was being transported to [facility] ED with report of altered mental status and decreased PO intake. Hospital liaison will continue to follow for disposition.    Please do not hesitate to call with any hospice related questions or concerns.    Thank you,    Bobbie "Loren Racer, [place], BSN [facility] Liaison [phone]

## 2021-11-07 NOTE — ED Triage Notes (Signed)
Pt hospice nurse asked for EMS to be called due to pt mental status change. Pt has covid and has not eaten or drank for several days. Pt had runs of svt with EMS that resolved prior to arrival. Pt appears to be dehydrated. EMS gave 700 ml of NS. Pt is a DNR.

## 2021-11-07 NOTE — ED Provider Notes (Signed)
[facility] Provider Note    Event Date/Time   First MD Initiated Contact with Patient 11/07/21 1302     (approximate)   History   Altered Mental Status   HPI  Sarah Callahan is a 88 y.o. female who presents to the ED from home by EMS for evaluation of increasing weakness. I reviewed neurology visit from 9/28.  History of Parkinson disease, wheelchair-bound.  DNR and under hospice care.  Family reports they would like medical treatment though, just no surgeries or resuscitation.  Patient presents to the ED by EMS for evaluation of decreased responsiveness and increased weakness.  She is unable to provide any relevant history due to her disorientation.  She has no complaints and reports no pain.  Majority of history is provided by granddaughter at the bedside who arrived later.  She reports that patient likely has COVID-19, as multiple people in the house have [place].  She reports that patient has not been eating, not been drinking and has been more tired and confused than typical.  Physical Exam   Triage Vital Signs: ED Triage Vitals  Enc Vitals Group     BP      Pulse      Resp      Temp      Temp src      SpO2      Weight      Height      Head Circumference      Peak Flow      Pain Score      Pain Loc      Pain Edu?      Excl. in [place]?     Most recent vital signs: Vitals:   11/07/21 1310 11/07/21 1317  BP:  126/69  Pulse:  76  Resp:  19  Temp:  (!) 97.4 F (36.3 C)  SpO2: 95% 97%    General: Somnolent.  Awakens to vocal stimulation and states awake.  Follows commands in all 4 extremities. CV:  Good peripheral perfusion.  Resp:  Normal effort.  No coughing Abd:  No distention.  No pain Other:  Follows commands in all 4 extremities without signs of neurologic deficits.   ED Results / Procedures / Treatments   Labs (all labs ordered are listed, but only abnormal results are displayed) Labs Reviewed  CBC WITH DIFFERENTIAL/PLATELET  - Abnormal; Notable for the following components:      Result Value   HCT 35.4 (*)    Lymphs Abs 0.6 (*)    All other components within normal limits  COMPREHENSIVE METABOLIC PANEL - Abnormal; Notable for the following components:   Sodium 127 (*)    Potassium 3.4 (*)    Chloride 95 (*)    Glucose, Bld 219 (*)    Creatinine, Ser 0.41 (*)    Calcium 8.2 (*)    Total Protein 6.0 (*)    Albumin 3.0 (*)    Alkaline Phosphatase 36 (*)    All other components within normal limits  RESP PANEL BY RT-PCR (FLU A&B, COVID) [id number]  URINALYSIS, COMPLETE (UACMP) WITH MICROSCOPIC    EKG Sinus rhythm, rate of 76 bpm.  Normal axis and intervals.  No STEMI.  RADIOLOGY 1 view CXR reviewed by me with bibasilar atelectasis.  No discrete infiltration.  Official radiology report(s): DG Chest Port 1 View  Result Date: 11/07/2021 CLINICAL DATA:  Altered mental status. EXAM: PORTABLE CHEST 1 VIEW COMPARISON:  April 09, 2018. FINDINGS: Stable cardiomediastinal  silhouette. Moderate size hiatal hernia is noted. Mild bibasilar subsegmental atelectasis is noted. Bony thorax is unremarkable. IMPRESSION: Moderate size hiatal hernia. Mild bibasilar subsegmental atelectasis. Electronically Signed   By: Marijo Conception M.D.   On: 11/07/2021 15:06    PROCEDURES and INTERVENTIONS:  .1-3 Lead EKG Interpretation Performed by: Vladimir Crofts, MD Authorized by: Vladimir Crofts, MD     Interpretation: normal     ECG rate:  78   ECG rate assessment: normal     Rhythm: sinus rhythm     Ectopy: none     Conduction: normal    Medications  lactated ringers bolus 1,000 mL (1,000 mLs Intravenous New Bag/Given 11/07/21 1355)     IMPRESSION / MDM / ASSESSMENT AND PLAN / ED COURSE  I reviewed the triage vital signs and the nursing notes.  88 year old female DNR presents to the ED for evaluation of increasing lethargy and poor p.o. intake in the setting of COVID-19, with evidence of dehydration and electrolyte disturbances  requiring medical observation admission.  She is lethargic on my evaluation without evidence of focal neurologic deficits, trauma or vascular deficits.  Blood work with some mild metabolic derangements with hyponatremia and hypokalemia.  She appears dry on examination and I anticipate her decreased functional status is due to metabolic derangements in the setting of COVID-19.  We will consult with hospitalist and anticipate medical admission.      FINAL CLINICAL IMPRESSION(S) / ED DIAGNOSES   Final diagnoses:  Dehydration  COVID-19  Hyponatremia     Rx / DC Orders   ED Discharge Orders     None        Note:  This document was prepared using Dragon voice recognition software and may include unintentional dictation errors.   Vladimir Crofts, MD 11/07/21 [id number]

## 2021-11-07 NOTE — ED Notes (Signed)
Purewik placed and attached to portable suction at this time.

## 2021-11-08 ENCOUNTER — Observation Stay

## 2021-11-08 ENCOUNTER — Other Ambulatory Visit: Payer: Self-pay

## 2021-11-08 DIAGNOSIS — E7800 Pure hypercholesterolemia, unspecified: Secondary | ICD-10-CM | POA: Diagnosis present

## 2021-11-08 DIAGNOSIS — Z66 Do not resuscitate: Secondary | ICD-10-CM | POA: Diagnosis present

## 2021-11-08 DIAGNOSIS — U071 COVID-19: Secondary | ICD-10-CM | POA: Diagnosis present

## 2021-11-08 DIAGNOSIS — R4182 Altered mental status, unspecified: Secondary | ICD-10-CM | POA: Diagnosis not present

## 2021-11-08 DIAGNOSIS — Z882 Allergy status to sulfonamides status: Secondary | ICD-10-CM | POA: Diagnosis not present

## 2021-11-08 DIAGNOSIS — E039 Hypothyroidism, unspecified: Secondary | ICD-10-CM | POA: Diagnosis present

## 2021-11-08 DIAGNOSIS — Z91041 Radiographic dye allergy status: Secondary | ICD-10-CM | POA: Diagnosis not present

## 2021-11-08 DIAGNOSIS — G9341 Metabolic encephalopathy: Secondary | ICD-10-CM | POA: Diagnosis not present

## 2021-11-08 DIAGNOSIS — R296 Repeated falls: Secondary | ICD-10-CM | POA: Diagnosis present

## 2021-11-08 DIAGNOSIS — G20 Parkinson's disease: Secondary | ICD-10-CM | POA: Diagnosis present

## 2021-11-08 DIAGNOSIS — Z880 Allergy status to penicillin: Secondary | ICD-10-CM | POA: Diagnosis not present

## 2021-11-08 DIAGNOSIS — F0280 Dementia in other diseases classified elsewhere without behavioral disturbance: Secondary | ICD-10-CM | POA: Diagnosis present

## 2021-11-08 DIAGNOSIS — K219 Gastro-esophageal reflux disease without esophagitis: Secondary | ICD-10-CM | POA: Diagnosis present

## 2021-11-08 DIAGNOSIS — E878 Other disorders of electrolyte and fluid balance, not elsewhere classified: Secondary | ICD-10-CM | POA: Diagnosis present

## 2021-11-08 DIAGNOSIS — Z9849 Cataract extraction status, unspecified eye: Secondary | ICD-10-CM | POA: Diagnosis not present

## 2021-11-08 DIAGNOSIS — E860 Dehydration: Secondary | ICD-10-CM | POA: Diagnosis present

## 2021-11-08 DIAGNOSIS — F32A Depression, unspecified: Secondary | ICD-10-CM | POA: Diagnosis present

## 2021-11-08 DIAGNOSIS — Z823 Family history of stroke: Secondary | ICD-10-CM | POA: Diagnosis not present

## 2021-11-08 DIAGNOSIS — E876 Hypokalemia: Secondary | ICD-10-CM | POA: Diagnosis present

## 2021-11-08 DIAGNOSIS — E1142 Type 2 diabetes mellitus with diabetic polyneuropathy: Secondary | ICD-10-CM | POA: Diagnosis present

## 2021-11-08 DIAGNOSIS — Z79899 Other long term (current) drug therapy: Secondary | ICD-10-CM | POA: Diagnosis not present

## 2021-11-08 DIAGNOSIS — Z85828 Personal history of other malignant neoplasm of skin: Secondary | ICD-10-CM | POA: Diagnosis not present

## 2021-11-08 DIAGNOSIS — Z888 Allergy status to other drugs, medicaments and biological substances status: Secondary | ICD-10-CM | POA: Diagnosis not present

## 2021-11-08 DIAGNOSIS — Z8249 Family history of ischemic heart disease and other diseases of the circulatory system: Secondary | ICD-10-CM | POA: Diagnosis not present

## 2021-11-08 DIAGNOSIS — E871 Hypo-osmolality and hyponatremia: Secondary | ICD-10-CM | POA: Diagnosis present

## 2021-11-08 DIAGNOSIS — Z885 Allergy status to narcotic agent status: Secondary | ICD-10-CM | POA: Diagnosis not present

## 2021-11-08 LAB — BASIC METABOLIC PANEL
Anion gap: 9 (ref 5–15)
BUN: 12 mg/dL (ref 8–23)
CO2: 23 mmol/L (ref 22–32)
Calcium: 8.2 mg/dL — ABNORMAL LOW (ref 8.9–10.3)
Chloride: 96 mmol/L — ABNORMAL LOW (ref 98–111)
Creatinine, Ser: 0.34 mg/dL — ABNORMAL LOW (ref 0.44–1.00)
GFR, Estimated: >60 mL/min (ref >60)
Glucose, Bld: 162 mg/dL — ABNORMAL HIGH (ref 70–99)
Potassium: 3 mmol/L — ABNORMAL LOW (ref 3.5–5.1)
Sodium: 128 mmol/L — ABNORMAL LOW (ref 135–145)

## 2021-11-08 LAB — CBC
HCT: 34.1 % — ABNORMAL LOW (ref 36.0–46.0)
Hemoglobin: 11.7 g/dL — ABNORMAL LOW (ref 12.0–15.0)
MCH: 30.9 pg (ref 26.0–34.0)
MCHC: 34.3 g/dL (ref 30.0–36.0)
MCV: 90 fL (ref 80.0–100.0)
Platelets: 304 10*3/uL (ref 150–400)
RBC: 3.79 MIL/uL — ABNORMAL LOW (ref 3.87–5.11)
RDW: 12.2 % (ref 11.5–15.5)
WBC: 7.8 10*3/uL (ref 4.0–10.5)
nRBC: 0 % (ref 0.0–0.2)

## 2021-11-08 LAB — TROPONIN I (HIGH SENSITIVITY)
Troponin I (High Sensitivity): 9 ng/L (ref <18)
Troponin I (High Sensitivity): 9 ng/L (ref <18)

## 2021-11-08 LAB — GLUCOSE, CAPILLARY
Glucose-Capillary: 156 mg/dL — ABNORMAL HIGH (ref 70–99)
Glucose-Capillary: 224 mg/dL — ABNORMAL HIGH (ref 70–99)

## 2021-11-08 LAB — CBG MONITORING, ED: Glucose-Capillary: 157 mg/dL — ABNORMAL HIGH (ref 70–99)

## 2021-11-08 LAB — MAGNESIUM: Magnesium: 1.7 mg/dL (ref 1.7–2.4)

## 2021-11-08 LAB — AMMONIA: Ammonia: 10 umol/L (ref 9–35)

## 2021-11-08 MED ORDER — SODIUM CHLORIDE 0.9 % IV SOLN
100.0000 mg | Freq: Every day | INTRAVENOUS | Status: AC
  Administered 2021-11-09 – 2021-11-10 (×2): 100 mg via INTRAVENOUS
  Filled 2021-11-08: qty 20
  Filled 2021-11-08: qty 100

## 2021-11-08 MED ORDER — POTASSIUM CHLORIDE CRYS ER 20 MEQ PO TBCR
30.0000 meq | EXTENDED_RELEASE_TABLET | ORAL | Status: AC
  Administered 2021-11-08 (×2): 30 meq via ORAL
  Filled 2021-11-08: qty 1
  Filled 2021-11-08: qty 2

## 2021-11-08 MED ORDER — SODIUM CHLORIDE 0.9 % IV SOLN: INTRAVENOUS | Status: DC

## 2021-11-08 MED ORDER — MAGNESIUM SULFATE 2 GM/50ML IV SOLN
2.0000 g | Freq: Once | INTRAVENOUS | Status: AC
  Administered 2021-11-08: 2 g via INTRAVENOUS
  Filled 2021-11-08: qty 50

## 2021-11-08 MED ORDER — SODIUM CHLORIDE 0.9 % IV SOLN
200.0000 mg | Freq: Once | INTRAVENOUS | Status: AC
  Administered 2021-11-08: 200 mg via INTRAVENOUS
  Filled 2021-11-08: qty 200

## 2021-11-08 NOTE — TOC Initial Note (Signed)
Transition of Care [facility]) - Initial/Assessment Note    Patient Details  Name: Sarah Callahan MRN: [id number] Date of Birth: 03-07-1931  [facility] of [facility]) CM/SW Contact:    Pete Pelt, RN Phone Number: 11/08/2021, 10:55 AM  Clinical Narrative:    Spoke to patient's daughter  [name].  She states that daughter stays with her under [facility] hospice; however family is also sick with COVID at this time.    Family states that they would like a call from MD to discuss, Care team notified     Barriers to Discharge: Continued Medical Work up   Patient Goals and CMS Choice     Choice offered to / list presented to : NA  Expected Discharge Plan and Services   In-house Referral: Hospice / [place] Discharge Planning Services: CM Consult Post Acute Care Choice:  (hospice) Living arrangements for the past 2 months: Single Family Home                                      Prior Living Arrangements/Services Living arrangements for the past 2 months: Single Family Home Lives with:: Self, Adult Children Patient language and need for interpreter reviewed:: Yes (spoke to daughter) Do you feel safe going back to the place where you live?: Yes      Need for Family Participation in Patient Care: Yes (Comment) Care giver support system in place?: Yes (comment)   Criminal Activity/Legal Involvement Pertinent to Current Situation/Hospitalization: No - Comment as needed  Activities of Daily Living      Permission Sought/Granted Permission sought to share information with : Case Manager Permission granted to share information with : Yes, Verbal Permission Granted              Emotional Assessment         Alcohol / Substance Use: Not Applicable Psych Involvement: No (comment)  Admission diagnosis:  Dehydration [E86.0] Hyponatremia [E87.1] SOB (shortness of breath) [R06.02] AMS (altered mental status) [R41.82] COVID [U07.1] COVID-19 [U07.1] Patient  Active Problem List   Diagnosis Date Noted   AMS (altered mental status) 11/07/2021   Age-related physical debility 11/07/2021   Bilateral hearing loss 11/07/2021   Recurrent falls 11/07/2021   Parkinson's disease ([place]) 03/02/2014   Dyspnea on exertion 12/16/2013   Type 2 diabetes mellitus with peripheral neuropathy ([place]) 12/16/2013   Hyperlipidemia 12/16/2013   Varicose veins of lower extremities with other complications 95/63/8756   Swelling of limb 02/05/2012   PCP:  Harlan Stains, MD Pharmacy:   [facility], [place] [address] Phone: [phone] Fax: [phone]     Social Determinants of Health (SDOH) Interventions    Readmission Risk Interventions No flowsheet data found.

## 2021-11-08 NOTE — ED Notes (Signed)
MD notified that pt became diaphoretic, pt less alert and oriented and pt being weaker. New vitals of rectal temp 99.1, increased RR 24-28 and increased WOB. Pt also urinated and defecated outside of purewick- brief changed and purewick replaced and pericare given. All bedding replaced.

## 2021-11-08 NOTE — Progress Notes (Signed)
[facility]) Hospitalized Hospice Patient Visit   Sarah Callahan is a current hospice patient with a terminal diagnosis of Parkinson's Disease. Notified by hospice nurse that patient was being transported to [facility] ED with report of altered mental status and decreased PO intake. Patient admitted to hospital with altered mental status. Per Dr. Gilford Rile with [facility], this is a related hospital admission.   Visited patient at bedside. Family member at bedside. Patient responds to voice but continues to keep eyes closed when answering questions. Denies needs that are not being met by hospital care team. Report exchanged with hospital care team. Patient will be receiving remdesivir IV. Hospital liaison will continue to follow for discharge disposition.   Patient remains inpatient appropriate in order to receive IV remdesivir for Covid infection and IV maintenance fluids.   V/S: 98.1, 125/62, 78, 16, 100% on RA    I/O:  700/no output documented   Abnormal Labs:  Sodium: 128 (L) Potassium: 3.0 (L) Chloride: 96 (L) Glucose: 162 (H) Creatinine: 0.34 (L) Calcium: 8.2 (L) RBC: 3.79 (L) Hemoglobin: 11.7 (L) HCT: 34.1 (L)  Diagnostics:   EXAM: PORTABLE CHEST 1 VIEW   COMPARISON:  April 09, 2018.   IMPRESSION: Moderate size hiatal hernia. Mild bibasilar subsegmental atelectasis.  EXAM: PORTABLE CHEST 1 VIEW   COMPARISON:  Portable chest 11/07/2021 and earlier.    IMPRESSION: Patchy and indistinct right lower lung opacity compatible with COVID-19 pneumonia in this setting. No pleural effusion or other acute cardiopulmonary abnormality.  IV/PRN: NS 125 ml/hr continuous IV, LR 1L bolus IV, Magnesium sulfate 50 mg IV x 1 dose, Remdesivir 200 mg IV x 1 dose   Problem List: Principal Problem:   AMS (altered mental status) Active Problems:   Type 2 diabetes mellitus with peripheral neuropathy (HCC)   Parkinson's disease (HCC)   Acute metabolic  encephalopathy   Acute metabolic encephalopathy secondary to COVID-19 infection.  Pretty much whole family is sick with COVID.  At baseline she is oriented to self only, has pretty advanced dementia and Parkinson's.  Mostly wheelchair-bound, family was using Hoyer lift to move her from bed to chair, dependent for all ADLs including feeding.  She appears little more lethargic than baseline. Remained on room air.  Initially prescribed Paxlovid, as she might not be able to go home in next 1 to 2 days, we will start remdesivir for 3 days. -Start her on remdesivir for 3 days--day 1 -Continue with supportive care and supplement.   Hyponatremia.  Most likely secondary to poor p.o. intake.  Corrected sodium at 129 this morning. -Switch IV fluid with normal saline,  -Monitor sodium   Hypokalemia.  Potassium at 3 with magnesium of 1.7 -Replete potassium and magnesium. -Continue to monitor   History of Parkinson's with dementia.  Patient appears at baseline. -Continue home dose of Sinemet   Hypothyroidism. -Continue home dose of Synthroid   Type 2 diabetes mellitus. -Continuing home dose of metformin   Depression. -Continue home dose of Paxil   Discharge Planning: Plan is for patient to return home with hospice services once stable for discharge.   Family Contact: Spoke with family member at bedside.   IDT: Updated   Goals of Care: Treat the treatable.   Please do not hesitate to call with any hospice related questions or concerns.    Thank you,    Bobbie "Loren Racer, [place], BSN [facility] Liaison [phone]

## 2021-11-08 NOTE — Progress Notes (Signed)
PROGRESS NOTE    Sarah Callahan  YCX:[id number] DOB: [birth year] DOA: 11/07/2021 PCP: Harlan Stains, MD   Brief Narrative: Taken from H&P. Sarah Callahan is a 88 y.o. female Parkinson's, type 2 diabetes, recurrent falls, who presents with concern for COVID infection.  Multiple family members sick with [place].  Patient was coughing and becoming more lethargic with decreased p.o. intake for few days. Patient is active with home hospice services, that is basically hospice/palliative services.  She is not at end-of-life care. COVID PCR came back positive.  Chest x-ray without any acute findings. Also found to have mild hyponatremia with sodium of 127, mild hypokalemia and hypochloremia. She received LR in ED. she was prescribed Paxlovid and initial plan was to send her back home after giving some IV fluid.  Whole family is pretty sick and unable to take care of her at this time. She is pretty dependent and wheelchair-bound at baseline, per daughter has not walked in months, she is unable to stand herself and family is using [employer] operational officer for transfers. She was started on remdesivir today.  Remained on room air.  Subjective: Patient was seen and examined today.  She appears quite lethargic.  Oriented to name only.  When I introduced myself, stating that why I need a doctor?  Assessment & Plan:   Principal Problem:   AMS (altered mental status) Active Problems:   Type 2 diabetes mellitus with peripheral neuropathy (HCC)   Parkinson's disease ([place])   Acute metabolic encephalopathy  Acute metabolic encephalopathy secondary to COVID-19 infection.  Pretty much whole family is sick with COVID.  At baseline she is oriented to self only, has pretty advanced dementia and Parkinson's.  Mostly wheelchair-bound, family was using Hoyer lift to move her from bed to chair, dependent for all ADLs including feeding.  She appears little more lethargic than baseline. Remained on room air.  Initially prescribed  Paxlovid, as she might not be able to go home in next 1 to 2 days, we will start remdesivir for 3 days. -Start her on remdesivir for 3 days--day 1 -Continue with supportive care and supplement.  Hyponatremia.  Most likely secondary to poor p.o. intake.  Corrected sodium at 129 this morning. -Switch IV fluid with normal saline,  -Monitor sodium  Hypokalemia.  Potassium at 3 with magnesium of 1.7 -Replete potassium and magnesium. -Continue to monitor  History of Parkinson's with dementia.  Patient appears at baseline. -Continue home dose of Sinemet  Hypothyroidism. -Continue home dose of Synthroid  Type 2 diabetes mellitus. -Continuing home dose of metformin  Depression. -Continue home dose of Paxil  Objective: Vitals:   11/08/21 0300 11/08/21 0600 11/08/21 1009 11/08/21 1302  BP: (!) 136/57 (!) 149/77 (!) 147/73 125/62  Pulse: 70 70 69 78  Resp: (!) 24 (!) 24 16 16   Temp: 99.1 F (37.3 C)  98.7 F (37.1 C) 98.1 F (36.7 C)  TempSrc: Rectal  Axillary Oral  SpO2: 95% 98% 95% 100%  Weight:      Height:       No intake or output data in the 24 hours ending 11/08/21 1317 Filed Weights   11/07/21 1319  Weight: 59 kg    Examination:  General exam: Appears calm and comfortable  Respiratory system: Clear to auscultation. Respiratory effort normal. Cardiovascular system: S1 & S2 heard, RRR.  Gastrointestinal system: Soft, nontender, nondistended, bowel sounds positive. Central nervous system: Alert and oriented to name only. Extremities: No edema, no cyanosis, pulses intact and symmetrical. Psychiatry: Judgement  and insight appear impaired.   DVT prophylaxis: Lovenox Code Status: DNR Family Communication: Talked with daughter. Disposition Plan:  Status is: Inpatient  Remains inpatient appropriate because: Severity of illness   Level of care: Med-Surg  All the records are reviewed and case discussed with Care Management/Social Worker. Management plans discussed  with the patient, nursing and they are in agreement.  Consultants:  None  Procedures:  Antimicrobials:   Data Reviewed: I have personally reviewed following labs and imaging studies  CBC: Recent Labs  Lab 11/07/21 1323 11/08/21 0513  WBC 8.6 7.8  NEUTROABS 7.4  --   HGB 12.0 11.7*  HCT 35.4* 34.1*  MCV 90.1 90.0  PLT 293 941   Basic Metabolic Panel: Recent Labs  Lab 11/07/21 1323 11/08/21 0513  NA 127* 128*  K 3.4* 3.0*  CL 95* 96*  CO2 23 23  GLUCOSE 219* 162*  BUN 19 12  CREATININE 0.41* 0.34*  CALCIUM 8.2* 8.2*  MG  --  1.7   GFR: Estimated Creatinine Clearance: 40.2 mL/min (A) (by C-G formula based on SCr of 0.34 mg/dL (L)). Liver Function Tests: Recent Labs  Lab 11/07/21 1323  AST 23  ALT <5  ALKPHOS 36*  BILITOT 0.7  PROT 6.0*  ALBUMIN 3.0*   No results for input(s): LIPASE, AMYLASE in the last 168 hours. Recent Labs  Lab 11/08/21 0721  AMMONIA 10   Coagulation Profile: No results for input(s): INR, PROTIME in the last 168 hours. Cardiac Enzymes: No results for input(s): CKTOTAL, CKMB, CKMBINDEX, TROPONINI in the last 168 hours. BNP (last 3 results) No results for input(s): PROBNP in the last 8760 hours. HbA1C: No results for input(s): HGBA1C in the last 72 hours. CBG: Recent Labs  Lab 11/08/21 0528 11/08/21 1302  GLUCAP 157* 156*   Lipid Profile: No results for input(s): CHOL, HDL, LDLCALC, TRIG, CHOLHDL, LDLDIRECT in the last 72 hours. Thyroid Function Tests: No results for input(s): TSH, T4TOTAL, FREET4, T3FREE, THYROIDAB in the last 72 hours. Anemia Panel: No results for input(s): VITAMINB12, FOLATE, FERRITIN, TIBC, IRON, RETICCTPCT in the last 72 hours. Sepsis Labs: No results for input(s): PROCALCITON, LATICACIDVEN in the last 168 hours.  Recent Results (from the past 240 hour(s))  Resp Panel by RT-PCR (Flu A&B, Covid) Nasopharyngeal Swab     Status: Abnormal   Collection Time: 11/07/21  2:16 PM   Specimen: Nasopharyngeal  Swab; Nasopharyngeal(NP) swabs in vial transport medium  Result Value Ref Range Status   SARS Coronavirus 2 by RT PCR POSITIVE (A) NEGATIVE Final    Comment: (NOTE) SARS-CoV-2 target nucleic acids are DETECTED.  The SARS-CoV-2 RNA is generally detectable in upper respiratory specimens during the acute phase of infection. Positive results are indicative of the presence of the identified virus, but do not rule out bacterial infection or co-infection with other pathogens not detected by the test. Clinical correlation with patient history and other diagnostic information is necessary to determine patient infection status. The expected result is Negative.  Fact Sheet for Patients: EntrepreneurPulse.com.au  Fact Sheet for Healthcare Providers: IncredibleEmployment.be  This test is not yet approved or cleared by the [place] FDA and  has been authorized for detection and/or diagnosis of SARS-CoV-2 by FDA under an Emergency Use Authorization (EUA).  This EUA will remain in effect (meaning this test can be used) for the duration of  the COVID-19 declaration under Section 564(b)(1) of the A ct, 21 U.S.C. section 360bbb-3(b)(1), unless the authorization is terminated or revoked sooner.  Influenza A by PCR NEGATIVE NEGATIVE Final   Influenza B by PCR NEGATIVE NEGATIVE Final    Comment: (NOTE) The Xpert Xpress SARS-CoV-2/FLU/RSV plus assay is intended as an aid in the diagnosis of influenza from Nasopharyngeal swab specimens and should not be used as a sole basis for treatment. Nasal washings and aspirates are unacceptable for Xpert Xpress SARS-CoV-2/FLU/RSV testing.  Fact Sheet for Patients: EntrepreneurPulse.com.au  Fact Sheet for Healthcare Providers: IncredibleEmployment.be  This test is not yet approved or cleared by the [place] FDA and has been authorized for detection and/or diagnosis of  SARS-CoV-2 by FDA under an Emergency Use Authorization (EUA). This EUA will remain in effect (meaning this test can be used) for the duration of the COVID-19 declaration under Section 564(b)(1) of the Act, 21 U.S.C. section 360bbb-3(b)(1), unless the authorization is terminated or revoked.  Performed at [facility], [address], [place], [place] [address]      Radiology Studies: CT HEAD WO CONTRAST (5MM)  Result Date: 11/08/2021 CLINICAL DATA:  88 year old female positive COVID-19. Decreased p.o. Altered mental status. EXAM: CT HEAD WITHOUT CONTRAST TECHNIQUE: Contiguous axial images were obtained from the base of the skull through the vertex without intravenous contrast. COMPARISON:  Head CT 02/15/2018. FINDINGS: Brain: Encephalomalacia in the anterior superior right frontal gyrus, site of mild subarachnoid hemorrhage in 2019. Cerebral volume elsewhere has not significantly changed. Patchy bilateral white matter hypodensity. Chronic appearing lacunar infarct of the right basal ganglia is new since 2019 on series 3, image 14. No superimposed midline shift, ventriculomegaly, mass effect, evidence of mass lesion, intracranial hemorrhage or evidence of cortically based acute infarction. Vascular: Calcified atherosclerosis at the skull base. No suspicious intracranial vascular hyperdensity. Skull: No acute osseous abnormality identified. Sinuses/Orbits: Mild to moderate scattered bilateral paranasal sinus mucosal thickening with some sinus fluid levels. Tympanic cavities and mastoids remain clear. Other: No acute orbit or scalp soft tissue finding. IMPRESSION: 1. No acute intracranial abnormality identified. New encephalomalacia in the right superior frontal gyrus corresponds to the small area of hemorrhage in 2019. And a lacunar infarct in the right basal ganglia is new since 2019 but appears chronic. 2. Mild to moderate bilateral paranasal sinus inflammation. Electronically Signed   By: Genevie Ann M.D.   On: 11/08/2021 06:24   DG Chest Port 1 View  Result Date: 11/08/2021 CLINICAL DATA:  89 year old female positive COVID-19. Decreased p.o. Altered mental status. EXAM: PORTABLE CHEST 1 VIEW COMPARISON:  Portable chest 11/07/2021 and earlier. FINDINGS: Portable AP upright view at 0543 hours. Stable lung volumes and mediastinal contours since 2019. Visualized tracheal air column is within normal limits. No pneumothorax, pleural effusion or consolidation. No pulmonary edema suspected. But there is increased patchy, indistinct right lower lung opacity compared to 2019. No acute osseous abnormality identified. Paucity of bowel gas in the upper abdomen. IMPRESSION: Patchy and indistinct right lower lung opacity compatible with COVID-19 pneumonia in this setting. No pleural effusion or other acute cardiopulmonary abnormality. Electronically Signed   By: Genevie Ann M.D.   On: 11/08/2021 06:19   DG Chest Port 1 View  Result Date: 11/07/2021 CLINICAL DATA:  Altered mental status. EXAM: PORTABLE CHEST 1 VIEW COMPARISON:  April 09, 2018. FINDINGS: Stable cardiomediastinal silhouette. Moderate size hiatal hernia is noted. Mild bibasilar subsegmental atelectasis is noted. Bony thorax is unremarkable. IMPRESSION: Moderate size hiatal hernia. Mild bibasilar subsegmental atelectasis. Electronically Signed   By: Marijo Conception M.D.   On: 11/07/2021 15:06    Scheduled Meds:  carbidopa-levodopa  1 tablet Oral QID   enoxaparin (LOVENOX) injection  40 mg Subcutaneous Q24H   levothyroxine  75 mcg Oral Q0600   metFORMIN  500 mg Oral Q supper   PARoxetine  10 mg Oral Daily   sodium chloride flush  3 mL Intravenous Q12H   Continuous Infusions:  sodium chloride 125 mL/hr at 11/08/21 1024   remdesivir 200 mg in sodium chloride 0.9% 250 mL IVPB     Followed by   Derrill Memo ON 11/09/2021] remdesivir 100 mg in NS 100 mL       LOS: 0 days   Time spent: 40 minutes. More than 50% of the time was spent in  counseling/coordination of care  Lorella Nimrod, MD [facility]  If 7PM-7AM, please contact night-coverage [email or web address]  11/08/2021, 1:17 PM   This record has been created using Systems analyst. Errors have been sought and corrected,but may not always be located. Such creation errors do not reflect on the standard of care.

## 2021-11-08 NOTE — ED Notes (Signed)
Per granddaughter, Sarah Callahan, please call with any changes. Was at bedside during day and would be leaving overnight to return tomorrow. Info put in contacts in computer

## 2021-11-09 DIAGNOSIS — R4182 Altered mental status, unspecified: Secondary | ICD-10-CM

## 2021-11-09 NOTE — Progress Notes (Signed)
PROGRESS NOTE    Sarah Callahan  JXB:[id number] DOB: [birth year] DOA: 11/07/2021 PCP: Harlan Stains, MD   Brief Narrative: Taken from H&P. Sarah Callahan is a 88 y.o. female Parkinson's, type 2 diabetes, recurrent falls, who presents with concern for COVID infection.  Multiple family members sick with [place].  Patient was coughing and becoming more lethargic with decreased p.o. intake for few days. Patient is active with home hospice services, that is basically hospice/palliative services.  She is not at end-of-life care. COVID PCR came back positive.  Chest x-ray without any acute findings. Also found to have mild hyponatremia with sodium of 127, mild hypokalemia and hypochloremia. She received LR in ED. she was prescribed Paxlovid and initial plan was to send her back home after giving some IV fluid.  Whole family is pretty sick and unable to take care of her at this time. She is pretty dependent and wheelchair-bound at baseline, per daughter has not walked in months, she is unable to stand herself and family is using Diplomatic Services operational officer for transfers. She was started on remdesivir today.  Remained on room air.  11/09/2021: Patient was seen and examined at her bedside.  She is hypersomnolent.  Minimally interactive.  Assessment & Plan:   Principal Problem:   AMS (altered mental status) Active Problems:   Type 2 diabetes mellitus with peripheral neuropathy (HCC)   Parkinson's disease ([place])   Acute metabolic encephalopathy  Persistent acute metabolic encephalopathy secondary to COVID-19 infection.  Pretty much whole family is sick with COVID.  At baseline she is oriented to self only, has pretty advanced dementia and Parkinson's.  Mostly wheelchair-bound, family was using Hoyer lift to move her from bed to chair, dependent for all ADLs including feeding.  She appears little more lethargic than baseline. Remained on room air.  Initially prescribed Paxlovid, as she might not be able to go home in next 1  to 2 days, we will start remdesivir for 3 days. Continue remdesivir for 3 days--day 2 -Continue with supportive care and supplement. Anticipated discharge date 11/10/2021.  Refractory hyponatremia.  Most likely secondary to poor p.o. intake.  Corrected sodium at 129 this morning. -Switch IV fluid with normal saline,  -Monitor sodium  Refractory hypokalemia.  Potassium at 3 with magnesium of 1.7 -Replete potassium and magnesium. -Continue to monitor  History of Parkinson's with dementia.  Patient appears at baseline. -Continue home dose of Sinemet  Hypothyroidism. Continue home dose of Synthroid  Type 2 diabetes mellitus. Continue home dose of metformin  Depression. Continue home dose of Paxil  Objective: Vitals:   11/09/21 0500 11/09/21 0755 11/09/21 1116 11/09/21 1315  BP: (!) 110/58 125/71 (!) 94/51 (!) 101/58  Pulse: 78 78 69 65  Resp: 16 16 16 15   Temp: 99.8 F (37.7 C) 98 F (36.7 C) 98.3 F (36.8 C) 98.3 F (36.8 C)  TempSrc: Oral Oral Oral Oral  SpO2: 95% 97% 94% 96%  Weight:      Height:        Intake/Output Summary (Last 24 hours) at 11/09/2021 1451 Last data filed at 11/09/2021 1434 Gross per 24 hour  Intake 3491.7 ml  Output 250 ml  Net 3241.7 ml   Filed Weights   11/07/21 1319  Weight: 59 kg    Examination:  General exam: Frail-appearing no acute distress.  She is hypersomnolent  respiratory system: Clear to auscultation poor inspiratory effort.   Cardiovascular system: Regular rate and rhythm no rubs or gallops  gastrointestinal system: Soft nontender normal  bowel sounds present.   Central nervous system: Minimally interactive.   Extremities: Trace edema lower extremities bilaterally.   Psychiatry: Unable to assess due to obtundation.   DVT prophylaxis: Lovenox Code Status: DNR Family Communication: Talked with daughter. Disposition Plan:  Status is: Inpatient  Remains inpatient appropriate because: Severity of illness   Level of care:  Med-Surg  All the records are reviewed and case discussed with Care Management/Social Worker. Management plans discussed with the patient, nursing and they are in agreement.  Consultants:  None  Procedures:  Antimicrobials:   Data Reviewed: I have personally reviewed following labs and imaging studies  CBC: Recent Labs  Lab 11/07/21 1323 11/08/21 0513  WBC 8.6 7.8  NEUTROABS 7.4  --   HGB 12.0 11.7*  HCT 35.4* 34.1*  MCV 90.1 90.0  PLT 293 762   Basic Metabolic Panel: Recent Labs  Lab 11/07/21 1323 11/08/21 0513  NA 127* 128*  K 3.4* 3.0*  CL 95* 96*  CO2 23 23  GLUCOSE 219* 162*  BUN 19 12  CREATININE 0.41* 0.34*  CALCIUM 8.2* 8.2*  MG  --  1.7   GFR: Estimated Creatinine Clearance: 40.2 mL/min (A) (by C-G formula based on SCr of 0.34 mg/dL (L)). Liver Function Tests: Recent Labs  Lab 11/07/21 1323  AST 23  ALT <5  ALKPHOS 36*  BILITOT 0.7  PROT 6.0*  ALBUMIN 3.0*   No results for input(s): LIPASE, AMYLASE in the last 168 hours. Recent Labs  Lab 11/08/21 0721  AMMONIA 10   Coagulation Profile: No results for input(s): INR, PROTIME in the last 168 hours. Cardiac Enzymes: No results for input(s): CKTOTAL, CKMB, CKMBINDEX, TROPONINI in the last 168 hours. BNP (last 3 results) No results for input(s): PROBNP in the last 8760 hours. HbA1C: No results for input(s): HGBA1C in the last 72 hours. CBG: Recent Labs  Lab 11/08/21 0528 11/08/21 1302 11/08/21 1630  GLUCAP 157* 156* 224*   Lipid Profile: No results for input(s): CHOL, HDL, LDLCALC, TRIG, CHOLHDL, LDLDIRECT in the last 72 hours. Thyroid Function Tests: No results for input(s): TSH, T4TOTAL, FREET4, T3FREE, THYROIDAB in the last 72 hours. Anemia Panel: No results for input(s): VITAMINB12, FOLATE, FERRITIN, TIBC, IRON, RETICCTPCT in the last 72 hours. Sepsis Labs: No results for input(s): PROCALCITON, LATICACIDVEN in the last 168 hours.  Recent Results (from the past 240 hour(s))   Resp Panel by RT-PCR (Flu A&B, Covid) Nasopharyngeal Swab     Status: Abnormal   Collection Time: 11/07/21  2:16 PM   Specimen: Nasopharyngeal Swab; Nasopharyngeal(NP) swabs in vial transport medium  Result Value Ref Range Status   SARS Coronavirus 2 by RT PCR POSITIVE (A) NEGATIVE Final    Comment: (NOTE) SARS-CoV-2 target nucleic acids are DETECTED.  The SARS-CoV-2 RNA is generally detectable in upper respiratory specimens during the acute phase of infection. Positive results are indicative of the presence of the identified virus, but do not rule out bacterial infection or co-infection with other pathogens not detected by the test. Clinical correlation with patient history and other diagnostic information is necessary to determine patient infection status. The expected result is Negative.  Fact Sheet for Patients: EntrepreneurPulse.com.au  Fact Sheet for Healthcare Providers: IncredibleEmployment.be  This test is not yet approved or cleared by the [place] FDA and  has been authorized for detection and/or diagnosis of SARS-CoV-2 by FDA under an Emergency Use Authorization (EUA).  This EUA will remain in effect (meaning this test can be used) for the duration  of  the COVID-19 declaration under Section 564(b)(1) of the A ct, 21 U.S.C. section 360bbb-3(b)(1), unless the authorization is terminated or revoked sooner.     Influenza A by PCR NEGATIVE NEGATIVE Final   Influenza B by PCR NEGATIVE NEGATIVE Final    Comment: (NOTE) The Xpert Xpress SARS-CoV-2/FLU/RSV plus assay is intended as an aid in the diagnosis of influenza from Nasopharyngeal swab specimens and should not be used as a sole basis for treatment. Nasal washings and aspirates are unacceptable for Xpert Xpress SARS-CoV-2/FLU/RSV testing.  Fact Sheet for Patients: EntrepreneurPulse.com.au  Fact Sheet for Healthcare  Providers: IncredibleEmployment.be  This test is not yet approved or cleared by the [place] FDA and has been authorized for detection and/or diagnosis of SARS-CoV-2 by FDA under an Emergency Use Authorization (EUA). This EUA will remain in effect (meaning this test can be used) for the duration of the COVID-19 declaration under Section 564(b)(1) of the Act, 21 U.S.C. section 360bbb-3(b)(1), unless the authorization is terminated or revoked.  Performed at [facility], [address], [place], [place] [address]      Radiology Studies: CT HEAD WO CONTRAST (5MM)  Result Date: 11/08/2021 CLINICAL DATA:  88 year old female positive COVID-19. Decreased p.o. Altered mental status. EXAM: CT HEAD WITHOUT CONTRAST TECHNIQUE: Contiguous axial images were obtained from the base of the skull through the vertex without intravenous contrast. COMPARISON:  Head CT 02/15/2018. FINDINGS: Brain: Encephalomalacia in the anterior superior right frontal gyrus, site of mild subarachnoid hemorrhage in 2019. Cerebral volume elsewhere has not significantly changed. Patchy bilateral white matter hypodensity. Chronic appearing lacunar infarct of the right basal ganglia is new since 2019 on series 3, image 14. No superimposed midline shift, ventriculomegaly, mass effect, evidence of mass lesion, intracranial hemorrhage or evidence of cortically based acute infarction. Vascular: Calcified atherosclerosis at the skull base. No suspicious intracranial vascular hyperdensity. Skull: No acute osseous abnormality identified. Sinuses/Orbits: Mild to moderate scattered bilateral paranasal sinus mucosal thickening with some sinus fluid levels. Tympanic cavities and mastoids remain clear. Other: No acute orbit or scalp soft tissue finding. IMPRESSION: 1. No acute intracranial abnormality identified. New encephalomalacia in the right superior frontal gyrus corresponds to the small area of hemorrhage in  2019. And a lacunar infarct in the right basal ganglia is new since 2019 but appears chronic. 2. Mild to moderate bilateral paranasal sinus inflammation. Electronically Signed   By: Genevie Ann M.D.   On: 11/08/2021 06:24   DG Chest Port 1 View  Result Date: 11/08/2021 CLINICAL DATA:  88 year old female positive COVID-19. Decreased p.o. Altered mental status. EXAM: PORTABLE CHEST 1 VIEW COMPARISON:  Portable chest 11/07/2021 and earlier. FINDINGS: Portable AP upright view at 0543 hours. Stable lung volumes and mediastinal contours since 2019. Visualized tracheal air column is within normal limits. No pneumothorax, pleural effusion or consolidation. No pulmonary edema suspected. But there is increased patchy, indistinct right lower lung opacity compared to 2019. No acute osseous abnormality identified. Paucity of bowel gas in the upper abdomen. IMPRESSION: Patchy and indistinct right lower lung opacity compatible with COVID-19 pneumonia in this setting. No pleural effusion or other acute cardiopulmonary abnormality. Electronically Signed   By: Genevie Ann M.D.   On: 11/08/2021 06:19   DG Chest Port 1 View  Result Date: 11/07/2021 CLINICAL DATA:  Altered mental status. EXAM: PORTABLE CHEST 1 VIEW COMPARISON:  April 09, 2018. FINDINGS: Stable cardiomediastinal silhouette. Moderate size hiatal hernia is noted. Mild bibasilar subsegmental atelectasis is noted. Bony thorax is unremarkable. IMPRESSION:  Moderate size hiatal hernia. Mild bibasilar subsegmental atelectasis. Electronically Signed   By: Marijo Conception M.D.   On: 11/07/2021 15:06    Scheduled Meds:  carbidopa-levodopa  1 tablet Oral QID   enoxaparin (LOVENOX) injection  40 mg Subcutaneous Q24H   levothyroxine  75 mcg Oral Q0600   metFORMIN  500 mg Oral Q supper   PARoxetine  10 mg Oral Daily   sodium chloride flush  3 mL Intravenous Q12H   Continuous Infusions:  sodium chloride 125 mL/hr at 11/09/21 1434   remdesivir 100 mg in NS 100 mL 100 mg (11/09/21  1031)     LOS: 1 day   Time spent: 40 minutes. More than 50% of the time was spent in counseling/coordination of care  Kayleen Memos, MD [facility]  If 7PM-7AM, please contact night-coverage [email or web address]  11/09/2021, 2:51 PM   This record has been created using Systems analyst. Errors have been sought and corrected,but may not always be located. Such creation errors do not reflect on the standard of care.

## 2021-11-09 NOTE — Progress Notes (Signed)
°   11/09/21 1116  Assess: MEWS Score  Temp 98.3 F (36.8 C)  BP (!) 94/51  Pulse Rate 69  Resp 16  SpO2 94 %  O2 Device Room Air  Assess: MEWS Score  MEWS Temp 0  MEWS Systolic 1  MEWS Pulse 0  MEWS RR 0  MEWS LOC 1  MEWS Score 2  MEWS Score Color Yellow  Assess: if the MEWS score is Yellow or Red  Were vital signs taken at a resting state? Yes  Focused Assessment No change from prior assessment  Does the patient meet 2 or more of the SIRS criteria? No  MEWS guidelines implemented *See Row Information* No, altered LOC is baseline  Treat  MEWS Interventions Other (Comment) (notified MD)  Pain Scale PAINAD  Pain Score 0  Breathing 0  Negative Vocalization 0  Facial Expression 0  Body Language 0  Consolability 0  PAINAD Score 0  Take Vital Signs  Increase Vital Sign Frequency  Yellow: Q 2hr X 2 then Q 4hr X 2, if remains yellow, continue Q 4hrs  Escalate  MEWS: Escalate Yellow: discuss with charge nurse/RN and consider discussing with provider and RRT (notified provider)  Notify: Provider  Provider Name/Title [place] Crane DO  Date Provider Notified 11/09/21  Time Provider Notified 1120  Notification Type Page  Notification Reason Other (Comment) (low BP)  Provider response No new orders  Date of Provider Response 11/09/21  Time of Provider Response 1133  Assess: SIRS CRITERIA  SIRS Temperature  0  SIRS Pulse 0  SIRS Respirations  0  SIRS WBC 0  SIRS Score Sum  0

## 2021-11-09 NOTE — Progress Notes (Signed)
[facility]) Hospitalized Hospice Patient Visit   Sarah Callahan is a current hospice patient with a terminal diagnosis of Parkinson's Disease. Notified by hospice nurse that patient was being transported to [facility] ED with report of altered mental status and decreased PO intake. Patient admitted to hospital with altered mental status. Per Dr. Gilford Rile with [facility], this is a related hospital admission.   Patient resting today and did not consume a lot of her breakfast. Daughter shared that patient typically is not a breakfast person. Daughter states that she has been providing staff with updates and suggestions of what various items she may eat rather than foods that were provided.  Report exchanged with hospital staff and staff shared that they are reaching out to the provider to address concerns with BP reading this AM.  Patient remains inpatient appropriate in order to receive IV remdesivir for Covid infection and IV maintenance fluids.  V/S:  Temp 98.3 F (36.8 C)  BP (!) 94/51  Pulse Rate 69  Resp 16  SpO2 94 %  O2 Device Room Air   I/O:  2255/250 Abnormal Labs:  No currents labs Diagnostics:   EXAM: PORTABLE CHEST 1 VIEW   COMPARISON:  April 09, 2018.   IMPRESSION: Moderate size hiatal hernia. Mild bibasilar subsegmental atelectasis.  EXAM: PORTABLE CHEST 1 VIEW   COMPARISON:  Portable chest 11/07/2021 and earlier.    IMPRESSION: Patchy and indistinct right lower lung opacity compatible with COVID-19 pneumonia in this setting. No pleural effusion or other acute cardiopulmonary abnormality.  IV/PRN: NS 125 ml/hr continuous IV, LR 1L bolus IV, Magnesium sulfate 50 mg IV x 1 dose, Remdesivir 200 mg IV x 1 dose   Problem List: Principal Problem:   AMS (altered mental status) Active Problems:   Type 2 diabetes mellitus with peripheral neuropathy (HCC)   Parkinson's disease (HCC)   Acute metabolic encephalopathy   Acute metabolic  encephalopathy secondary to COVID-19 infection.  Pretty much whole family is sick with COVID.  At baseline she is oriented to self only, has pretty advanced dementia and Parkinson's.  Mostly wheelchair-bound, family was using Hoyer lift to move her from bed to chair, dependent for all ADLs including feeding.  She appears little more lethargic than baseline. Remained on room air.  Initially prescribed Paxlovid, as she might not be able to go home in next 1 to 2 days, we will start remdesivir for 3 days. -Start her on remdesivir for 3 days--day 1 -Continue with supportive care and supplement. Hyponatremia.  Most likely secondary to poor p.o. intake.  Corrected sodium at 129 this morning. -Switch IV fluid with normal saline,  -Monitor sodium   Hypokalemia.  Potassium at 3 with magnesium of 1.7 -Replete potassium and magnesium. -Continue to monitor   History of Parkinson's with dementia.  Patient appears at baseline. -Continue home dose of Sinemet   Hypothyroidism. -Continue home dose of Synthroid   Type 2 diabetes mellitus. -Continuing home dose of metformin   Depression. -Continue home dose of Paxil   Discharge Planning: Plan is for patient to return home with hospice services once stable for discharge.   Family Contact:    IDT: Updated   Goals of Care: Treat the treatable. Please do not hesitate to call with any hospice related questions.    Thank you for the opportunity to participate in this patient's care.  Daphene Calamity, MSW [facility] Liaison  [phone]

## 2021-11-10 LAB — BASIC METABOLIC PANEL
Anion gap: 7 (ref 5–15)
BUN: 10 mg/dL (ref 8–23)
CO2: 25 mmol/L (ref 22–32)
Calcium: 7.8 mg/dL — ABNORMAL LOW (ref 8.9–10.3)
Chloride: 102 mmol/L (ref 98–111)
Creatinine, Ser: <0.3 mg/dL — ABNORMAL LOW (ref 0.44–1.00)
Glucose, Bld: 122 mg/dL — ABNORMAL HIGH (ref 70–99)
Potassium: 3.1 mmol/L — ABNORMAL LOW (ref 3.5–5.1)
Sodium: 134 mmol/L — ABNORMAL LOW (ref 135–145)

## 2021-11-10 LAB — CBC
HCT: 33 % — ABNORMAL LOW (ref 36.0–46.0)
Hemoglobin: 11.2 g/dL — ABNORMAL LOW (ref 12.0–15.0)
MCH: 30.4 pg (ref 26.0–34.0)
MCHC: 33.9 g/dL (ref 30.0–36.0)
MCV: 89.7 fL (ref 80.0–100.0)
Platelets: 306 10*3/uL (ref 150–400)
RBC: 3.68 MIL/uL — ABNORMAL LOW (ref 3.87–5.11)
RDW: 12.1 % (ref 11.5–15.5)
WBC: 6.3 10*3/uL (ref 4.0–10.5)
nRBC: 0 % (ref 0.0–0.2)

## 2021-11-10 MED ORDER — LORAZEPAM 0.5 MG PO TABS: 0.5000 mg | ORAL_TABLET | Freq: Three times a day (TID) | ORAL | 0 refills | Status: AC | PRN

## 2021-11-10 MED ORDER — OXYCODONE HCL 5 MG PO TABS: 5.0000 mg | ORAL_TABLET | Freq: Four times a day (QID) | ORAL | 0 refills | Status: AC | PRN

## 2021-11-10 MED ORDER — POTASSIUM CHLORIDE CRYS ER 20 MEQ PO TBCR
40.0000 meq | EXTENDED_RELEASE_TABLET | Freq: Three times a day (TID) | ORAL | Status: DC
  Administered 2021-11-10: 40 meq via ORAL
  Filled 2021-11-10: qty 2

## 2021-11-10 MED ORDER — POTASSIUM CHLORIDE CRYS ER 20 MEQ PO TBCR: 40.0000 meq | EXTENDED_RELEASE_TABLET | Freq: Every day | ORAL | 0 refills | Status: AC

## 2021-11-10 MED ORDER — POLYETHYLENE GLYCOL 3350 17 G PO PACK: 17.0000 g | PACK | Freq: Every day | ORAL | 0 refills | Status: AC | PRN

## 2021-11-10 NOTE — Plan of Care (Signed)
  Problem: Clinical Measurements: Goal: Ability to maintain clinical measurements within normal limits will improve Outcome: Adequate for Discharge Goal: Will remain free from infection Outcome: Adequate for Discharge Goal: Diagnostic test results will improve Outcome: Adequate for Discharge Goal: Respiratory complications will improve Outcome: Adequate for Discharge Goal: Cardiovascular complication will be avoided Outcome: Adequate for Discharge   

## 2021-11-10 NOTE — Discharge Summary (Signed)
Discharge Summary  Sarah Callahan JJH:[id number] DOB: 17-May-1931  PCP: Harlan Stains, MD  Admit date: 11/07/2021 Discharge date: 11/10/2021  Time spent: 35 minutes.  Recommendations for Outpatient Follow-up:  Continue with hospice care.  Discharge Diagnoses:  Active Hospital Problems   Diagnosis Date Noted   AMS (altered mental status) 81/85/6314   Acute metabolic encephalopathy 97/12/6376   Parkinson's disease ([place]) 03/02/2014   Type 2 diabetes mellitus with peripheral neuropathy ([place]) 12/16/2013    Resolved Hospital Problems  No resolved problems to display.    Discharge Condition: Stable.  Diet recommendation: Pleasure feedings.  Vitals:   11/10/21 0906 11/10/21 1221  BP: (!) 154/83 (!) 164/88  Pulse: 74 79  Resp: 17 17  Temp: 98 F (36.7 C) 97.6 F (36.4 C)  SpO2: 98% 96%    History of present illness:  Sarah Callahan is a 88 y.o. female Parkinson's, type 2 diabetes, recurrent falls, who presents with concern for COVID infection.  Multiple family members sick with [place].  Patient was coughing and becoming more lethargic with decreased p.o. intake for few days. Patient is active with home hospice services, that is basically hospice/palliative services.  She is not at end-of-life care. COVID PCR came back positive.  Chest x-ray without any acute findings. Also found to have mild hyponatremia with sodium of 127, mild hypokalemia and hypochloremia. She received LR in ED. she was prescribed Paxlovid and initial plan was to send her back home after giving some IV fluid.  Whole family is pretty sick and unable to take care of her at this time. She is pretty dependent and wheelchair-bound at baseline, per daughter has not walked in months, she is unable to stand herself and family is using [employer] operational officer for transfers. She was started on remdesivir today.  Remained on room air.   11/10/2021: Patient was seen and examined at her bedside this morning.  More alert today however  minimally interactive.  Does not appear in distress.   Hospital Course:  Principal Problem:   AMS (altered mental status) Active Problems:   Type 2 diabetes mellitus with peripheral neuropathy (HCC)   Parkinson's disease ([place])   Acute metabolic encephalopathy  Acute metabolic encephalopathy secondary to COVID-19 viral infection.   Completed 3 days of IV antiviral Remdesivir More alert on 11/10/2021, minimally interactive   Improving hyponatremia.   Most likely secondary to poor p.o. intake.   Serum sodium 134 from 127. Pleasure feeding with assistance Follow-up with hospice care   Refractory hypokalemia.  Continue potassium replacement 40 mEq daily x5 days. Follow-up with hospice care.   History of Parkinson's with dementia.   Patient appears at baseline. -Continue home dose of Sinemet   Hypothyroidism. Continue home dose of Synthroid   Type 2 diabetes mellitus. Continue home dose of metformin   Depression. Continue home dose of Paxil    Discharge Exam: BP (!) 164/88 (BP Location: Left Arm)    Pulse 79    Temp 97.6 F (36.4 C) (Oral)    Resp 17    Ht 5\' 3"  (1.6 m)    Wt 59 kg    SpO2 96%    BMI 23.03 kg/m  General: 88 y.o. year-old female very frail appearing.  In no acute distress.  Alert and minimally interactive.   Cardiovascular: Regular rate and rhythm with no rubs or gallops.  No thyromegaly or JVD noted.   Respiratory: Clear to auscultation with no wheezes or rales.  Poor inspiratory effort. Abdomen: Soft nontender nondistended with normal  bowel sounds. Psychiatry: Mood is appropriate for condition and setting  Discharge Instructions You were cared for by a hospitalist during your hospital stay. If you have any questions about your discharge medications or the care you received while you were in the hospital after you are discharged, you can call the unit and asked to speak with the hospitalist on call if the hospitalist that took care of you is not available.  Once you are discharged, your primary care physician will handle any further medical issues. Please note that NO REFILLS for any discharge medications will be authorized once you are discharged, as it is imperative that you return to your primary care physician (or establish a relationship with a primary care physician if you do not have one) for your aftercare needs so that they can reassess your need for medications and monitor your lab values.   Allergies as of 11/10/2021       Reactions   Azilect [rasagiline] Nausea And Vomiting   Cephalexin Other (See Comments)   Gets UTI after taking   Codeine Itching   Crestor [rosuvastatin Calcium] Other (See Comments)   Tired, confused, GI issues   E-mycin [erythromycin Base] Nausea And Vomiting   Elavil [amitriptyline] Other (See Comments)   Fosamax [alendronate] Other (See Comments)   Body aches   Influenza Virus Vaccine Split Other (See Comments)   Side effects   Iodine Other (See Comments)   infection   Lidocaine Other (See Comments)   Feels hot, turns red   Neurontin [gabapentin] Other (See Comments)   Depression   Novocain [procaine] Other (See Comments)   Penicillins Hives   Pravastatin Other (See Comments)   Bone and joint pain   Sulfa Drugs Cross Reactors Hives   Tramadol Other (See Comments)   Muscle pain   Darvon [propoxyphene] Rash   Mercury Rash        Medication List     STOP taking these medications    COQ-10 PO   denosumab 60 MG/ML Soln injection Commonly known as: PROLIA   FLAX SEED OIL PO   fluticasone 50 MCG/ACT nasal spray Commonly known as: FLONASE   furosemide 20 MG tablet Commonly known as: LASIX   HEALTHY EYES PO   Magnesium 250 MG Tabs   melatonin 3 MG Tabs tablet   MULTI-VITAMIN GUMMIES PO   OMEGA-3 COMPLEX PO   VITAMIN B-1 PO   Vitamin C Chew   Vitamin D-3 125 MCG (5000 UT) Tabs   vitamin E 200 UNIT capsule Generic drug: vitamin E       TAKE these medications     carbidopa-levodopa 25-100 MG tablet Commonly known as: SINEMET IR Take 1 tablet by mouth 4 (four) times daily. What changed: how much to take   levothyroxine 75 MCG tablet Commonly known as: SYNTHROID Take 75 mcg by mouth daily.   LORazepam 0.5 MG tablet Commonly known as: Ativan Take 1 tablet (0.5 mg total) by mouth every 8 (eight) hours as needed for up to 3 days for anxiety.   metFORMIN 500 MG 24 hr tablet Commonly known as: GLUCOPHAGE-XR Take 500 mg by mouth daily with breakfast.   oxyCODONE 5 MG immediate release tablet Commonly known as: Oxy IR/ROXICODONE Take 1 tablet (5 mg total) by mouth every 6 (six) hours as needed for up to 3 days for moderate pain. What changed:  how much to take reasons to take this   PARoxetine 10 MG tablet Commonly known as: PAXIL Take 10 mg by  mouth daily. One tablet daily   polyethylene glycol 17 g packet Commonly known as: MIRALAX / GLYCOLAX Take 17 g by mouth daily as needed for mild constipation.   potassium chloride SA 20 MEQ tablet Commonly known as: KLOR-CON M Take 2 tablets (40 mEq total) by mouth daily for 5 days.       Allergies  Allergen Reactions   Azilect [Rasagiline] Nausea And Vomiting   Cephalexin Other (See Comments)    Gets UTI after taking   Codeine Itching   Crestor [Rosuvastatin Calcium] Other (See Comments)    Tired, confused, GI issues   E-Mycin [Erythromycin Base] Nausea And Vomiting   Elavil [Amitriptyline] Other (See Comments)   Fosamax [Alendronate] Other (See Comments)    Body aches   Influenza Virus Vaccine Split Other (See Comments)    Side effects   Iodine Other (See Comments)    infection   Lidocaine Other (See Comments)    Feels hot, turns red   Neurontin [Gabapentin] Other (See Comments)    Depression   Novocain [Procaine] Other (See Comments)   Penicillins Hives   Pravastatin Other (See Comments)    Bone and joint pain   Sulfa Drugs Cross Reactors Hives   Tramadol Other (See  Comments)    Muscle pain   Darvon [Propoxyphene] Rash   Mercury Rash      The results of significant diagnostics from this hospitalization (including imaging, microbiology, ancillary and laboratory) are listed below for reference.    Significant Diagnostic Studies: CT HEAD WO CONTRAST (5MM)  Result Date: 11/08/2021 CLINICAL DATA:  88 year old female positive COVID-19. Decreased p.o. Altered mental status. EXAM: CT HEAD WITHOUT CONTRAST TECHNIQUE: Contiguous axial images were obtained from the base of the skull through the vertex without intravenous contrast. COMPARISON:  Head CT 02/15/2018. FINDINGS: Brain: Encephalomalacia in the anterior superior right frontal gyrus, site of mild subarachnoid hemorrhage in 2019. Cerebral volume elsewhere has not significantly changed. Patchy bilateral white matter hypodensity. Chronic appearing lacunar infarct of the right basal ganglia is new since 2019 on series 3, image 14. No superimposed midline shift, ventriculomegaly, mass effect, evidence of mass lesion, intracranial hemorrhage or evidence of cortically based acute infarction. Vascular: Calcified atherosclerosis at the skull base. No suspicious intracranial vascular hyperdensity. Skull: No acute osseous abnormality identified. Sinuses/Orbits: Mild to moderate scattered bilateral paranasal sinus mucosal thickening with some sinus fluid levels. Tympanic cavities and mastoids remain clear. Other: No acute orbit or scalp soft tissue finding. IMPRESSION: 1. No acute intracranial abnormality identified. New encephalomalacia in the right superior frontal gyrus corresponds to the small area of hemorrhage in 2019. And a lacunar infarct in the right basal ganglia is new since 2019 but appears chronic. 2. Mild to moderate bilateral paranasal sinus inflammation. Electronically Signed   By: Genevie Ann M.D.   On: 11/08/2021 06:24   DG Chest Port 1 View  Result Date: 11/08/2021 CLINICAL DATA:  88 year old female positive  COVID-19. Decreased p.o. Altered mental status. EXAM: PORTABLE CHEST 1 VIEW COMPARISON:  Portable chest 11/07/2021 and earlier. FINDINGS: Portable AP upright view at 0543 hours. Stable lung volumes and mediastinal contours since 2019. Visualized tracheal air column is within normal limits. No pneumothorax, pleural effusion or consolidation. No pulmonary edema suspected. But there is increased patchy, indistinct right lower lung opacity compared to 2019. No acute osseous abnormality identified. Paucity of bowel gas in the upper abdomen. IMPRESSION: Patchy and indistinct right lower lung opacity compatible with COVID-19 pneumonia in this setting. No pleural effusion  or other acute cardiopulmonary abnormality. Electronically Signed   By: Genevie Ann M.D.   On: 11/08/2021 06:19   DG Chest Port 1 View  Result Date: 11/07/2021 CLINICAL DATA:  Altered mental status. EXAM: PORTABLE CHEST 1 VIEW COMPARISON:  April 09, 2018. FINDINGS: Stable cardiomediastinal silhouette. Moderate size hiatal hernia is noted. Mild bibasilar subsegmental atelectasis is noted. Bony thorax is unremarkable. IMPRESSION: Moderate size hiatal hernia. Mild bibasilar subsegmental atelectasis. Electronically Signed   By: Marijo Conception M.D.   On: 11/07/2021 15:06    Microbiology: Recent Results (from the past 240 hour(s))  Resp Panel by RT-PCR (Flu A&B, Covid) Nasopharyngeal Swab     Status: Abnormal   Collection Time: 11/07/21  2:16 PM   Specimen: Nasopharyngeal Swab; Nasopharyngeal(NP) swabs in vial transport medium  Result Value Ref Range Status   SARS Coronavirus 2 by RT PCR POSITIVE (A) NEGATIVE Final    Comment: (NOTE) SARS-CoV-2 target nucleic acids are DETECTED.  The SARS-CoV-2 RNA is generally detectable in upper respiratory specimens during the acute phase of infection. Positive results are indicative of the presence of the identified virus, but do not rule out bacterial infection or co-infection with other pathogens not detected  by the test. Clinical correlation with patient history and other diagnostic information is necessary to determine patient infection status. The expected result is Negative.  Fact Sheet for Patients: EntrepreneurPulse.com.au  Fact Sheet for Healthcare Providers: IncredibleEmployment.be  This test is not yet approved or cleared by the [place] FDA and  has been authorized for detection and/or diagnosis of SARS-CoV-2 by FDA under an Emergency Use Authorization (EUA).  This EUA will remain in effect (meaning this test can be used) for the duration of  the COVID-19 declaration under Section 564(b)(1) of the A ct, 21 U.S.C. section 360bbb-3(b)(1), unless the authorization is terminated or revoked sooner.     Influenza A by PCR NEGATIVE NEGATIVE Final   Influenza B by PCR NEGATIVE NEGATIVE Final    Comment: (NOTE) The Xpert Xpress SARS-CoV-2/FLU/RSV plus assay is intended as an aid in the diagnosis of influenza from Nasopharyngeal swab specimens and should not be used as a sole basis for treatment. Nasal washings and aspirates are unacceptable for Xpert Xpress SARS-CoV-2/FLU/RSV testing.  Fact Sheet for Patients: EntrepreneurPulse.com.au  Fact Sheet for Healthcare Providers: IncredibleEmployment.be  This test is not yet approved or cleared by the [place] FDA and has been authorized for detection and/or diagnosis of SARS-CoV-2 by FDA under an Emergency Use Authorization (EUA). This EUA will remain in effect (meaning this test can be used) for the duration of the COVID-19 declaration under Section 564(b)(1) of the Act, 21 U.S.C. section 360bbb-3(b)(1), unless the authorization is terminated or revoked.  Performed at [facility], [address] [place], [place] [address]      Labs: Basic Metabolic Panel: Recent Labs  Lab 11/07/21 1323 11/08/21 0513 11/10/21 0542  NA 127* 128* 134*   K 3.4* 3.0* 3.1*  CL 95* 96* 102  CO2 23 23 25   GLUCOSE 219* 162* 122*  BUN 19 12 10   CREATININE 0.41* 0.34* <0.30*  CALCIUM 8.2* 8.2* 7.8*  MG  --  1.7  --    Liver Function Tests: Recent Labs  Lab 11/07/21 1323  AST 23  ALT <5  ALKPHOS 36*  BILITOT 0.7  PROT 6.0*  ALBUMIN 3.0*   No results for input(s): LIPASE, AMYLASE in the last 168 hours. Recent Labs  Lab 11/08/21 0721  AMMONIA 10  CBC: Recent Labs  Lab 11/07/21 1323 11/08/21 0513 11/10/21 0542  WBC 8.6 7.8 6.3  NEUTROABS 7.4  --   --   HGB 12.0 11.7* 11.2*  HCT 35.4* 34.1* 33.0*  MCV 90.1 90.0 89.7  PLT 293 304 306   Cardiac Enzymes: No results for input(s): CKTOTAL, CKMB, CKMBINDEX, TROPONINI in the last 168 hours. BNP: BNP (last 3 results) No results for input(s): BNP in the last 8760 hours.  ProBNP (last 3 results) No results for input(s): PROBNP in the last 8760 hours.  CBG: Recent Labs  Lab 11/08/21 0528 11/08/21 1302 11/08/21 1630  GLUCAP 157* 156* 224*       Signed:  Kayleen Memos, MD [facility] 11/10/2021, 3:50 PM

## 2021-11-10 NOTE — TOC Transition Note (Signed)
Transition of Care [facility]) - CM/SW Discharge Note   Patient Details  Name: Yeraldi Fidler MRN: [id number] Date of Birth: April 16, 1931  Transition of Care [facility]) CM/SW Contact:  Candie Chroman, LCSW Phone Number: 11/10/2021, 10:35 AM   Clinical Narrative:  Patient has orders to discharge home today. [facility] liaison is aware. Daughter confirmed address on facesheet is correct. EMS transport has been arranged and she is 2nd on the list. No further concerns. CSW signing off.   Final next level of care: Home w Hospice Care Barriers to Discharge: Barriers Resolved   Patient Goals and CMS Choice     Choice offered to / list presented to : NA  Discharge Placement                Patient to be transferred to facility by: EMS Name of family member notified: Charletta Cousin Patient and family notified of of transfer: 11/10/21  Discharge Plan and Services In-house Referral: Hospice / Palliative Care Discharge Planning Services: CM Consult Post Acute Care Choice:  (hospice)                               Social Determinants of Health (SDOH) Interventions     Readmission Risk Interventions No flowsheet data found.

## 2023-09-07 DEATH — deceased
